# Patient Record
Sex: Male | Born: 1935
Health system: Southern US, Community
[De-identification: ages and names within clinical notes are randomized; demographics above are authoritative.]

## PROBLEM LIST (undated history)

## (undated) DIAGNOSIS — I639 Cerebral infarction, unspecified: Secondary | ICD-10-CM

## (undated) DIAGNOSIS — K648 Other hemorrhoids: Secondary | ICD-10-CM

## (undated) DIAGNOSIS — M1710 Unilateral primary osteoarthritis, unspecified knee: Secondary | ICD-10-CM

## (undated) DIAGNOSIS — C801 Malignant (primary) neoplasm, unspecified: Secondary | ICD-10-CM

## (undated) DIAGNOSIS — I2510 Atherosclerotic heart disease of native coronary artery without angina pectoris: Secondary | ICD-10-CM

## (undated) DIAGNOSIS — M179 Osteoarthritis of knee, unspecified: Secondary | ICD-10-CM

## (undated) DIAGNOSIS — R413 Other amnesia: Secondary | ICD-10-CM

## (undated) DIAGNOSIS — E785 Hyperlipidemia, unspecified: Secondary | ICD-10-CM

## (undated) DIAGNOSIS — I10 Essential (primary) hypertension: Secondary | ICD-10-CM

## (undated) DIAGNOSIS — T7840XA Allergy, unspecified, initial encounter: Secondary | ICD-10-CM

## (undated) HISTORY — DX: Atherosclerotic heart disease of native coronary artery without angina pectoris: I25.10

## (undated) HISTORY — DX: Osteoarthritis of knee, unspecified: M17.9

## (undated) HISTORY — PX: OTHER SURGICAL HISTORY: SHX169

## (undated) HISTORY — DX: Unilateral primary osteoarthritis, unspecified knee: M17.10

## (undated) HISTORY — DX: Other hemorrhoids: K64.8

## (undated) HISTORY — PX: CARPAL TUNNEL RELEASE: SHX101

## (undated) HISTORY — PX: WRIST FRACTURE SURGERY: SHX121

## (undated) HISTORY — DX: Other amnesia: R41.3

## (undated) HISTORY — DX: Hyperlipidemia, unspecified: E78.5

## (undated) HISTORY — DX: Allergy, unspecified, initial encounter: T78.40XA

---

## 1999-07-07 ENCOUNTER — Emergency Department (HOSPITAL_COMMUNITY): Admission: EM | Admit: 1999-07-07 | Discharge: 1999-07-07 | Payer: Self-pay | Admitting: Emergency Medicine

## 2003-12-19 IMAGING — CR DG CHEST 2V
2 series · 2 of 2 positions shown · non-contrast
Comparison: none

CLINICAL DATA: Preop respiratory exam for knee surgery.  
 CHEST TWO VIEWS:
 Heart size is normal.  The mediastinum is unremarkable.  The lungs are clear.  No acute soft tissue or bony findings.

[view not recorded (1 of 2)]
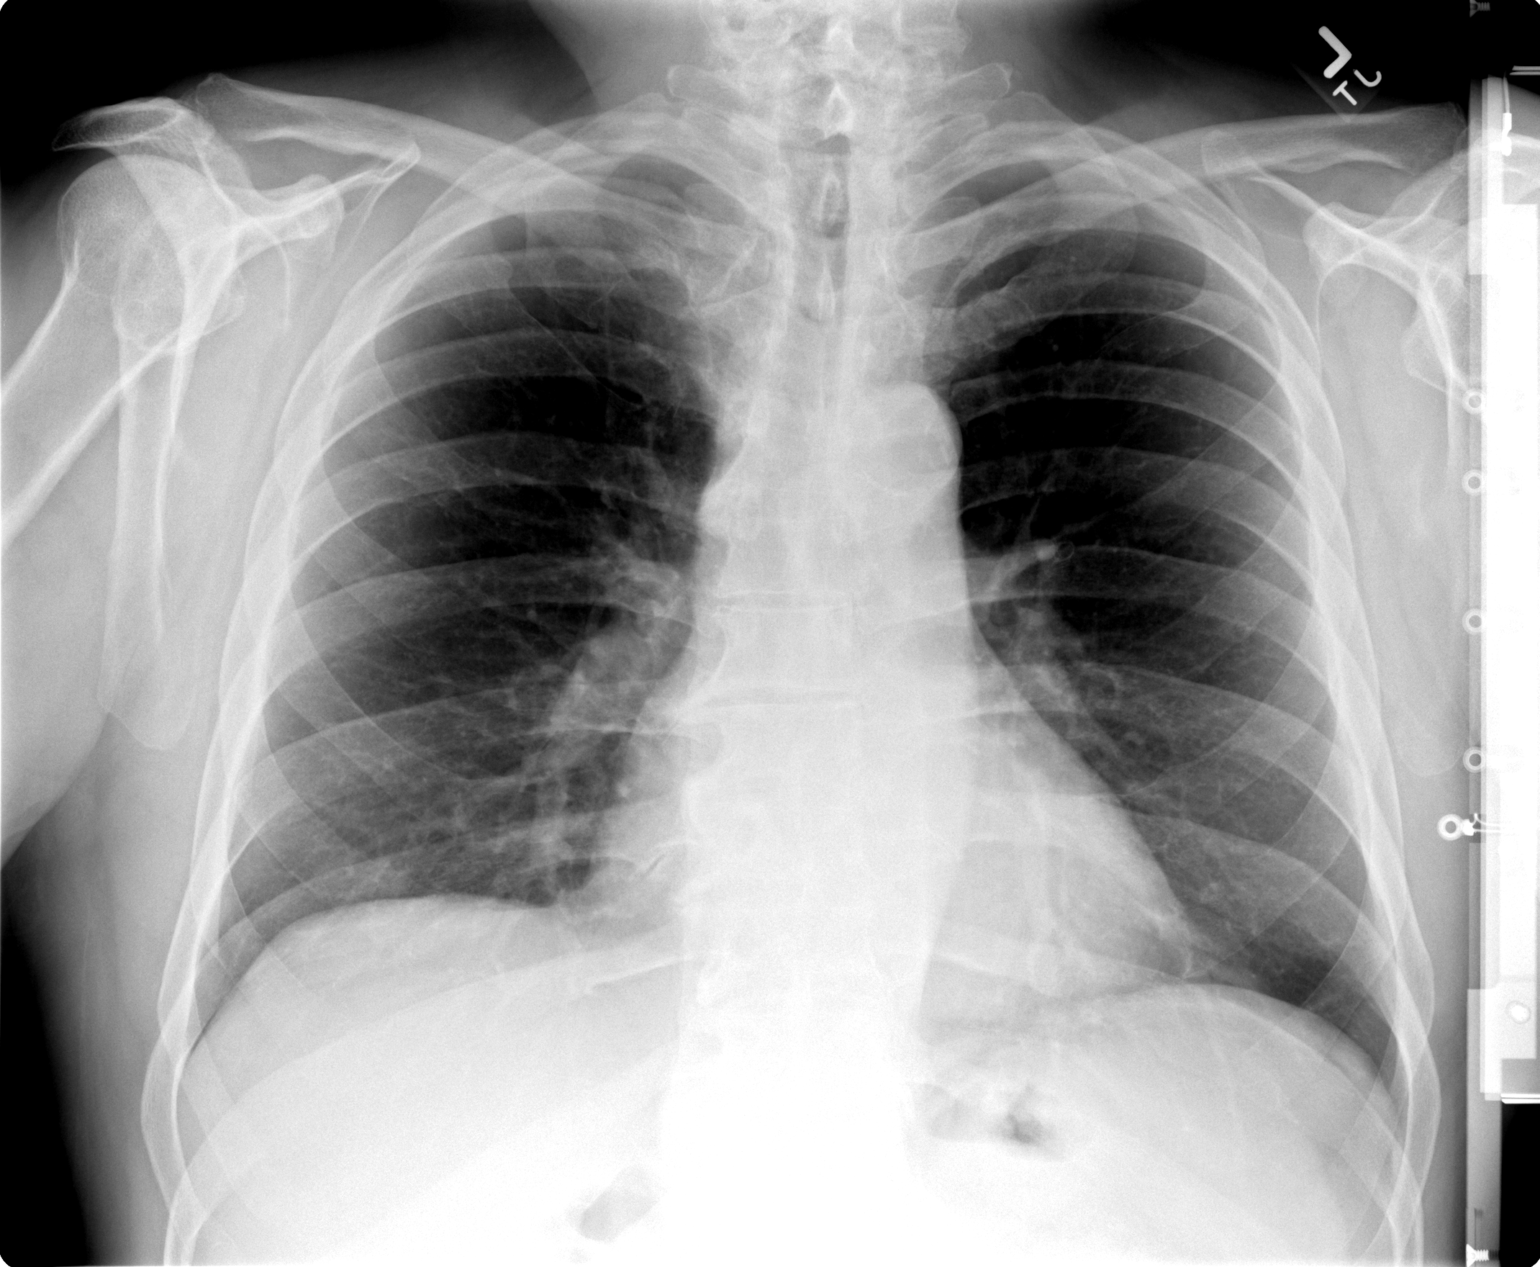

[view not recorded (2 of 2)]
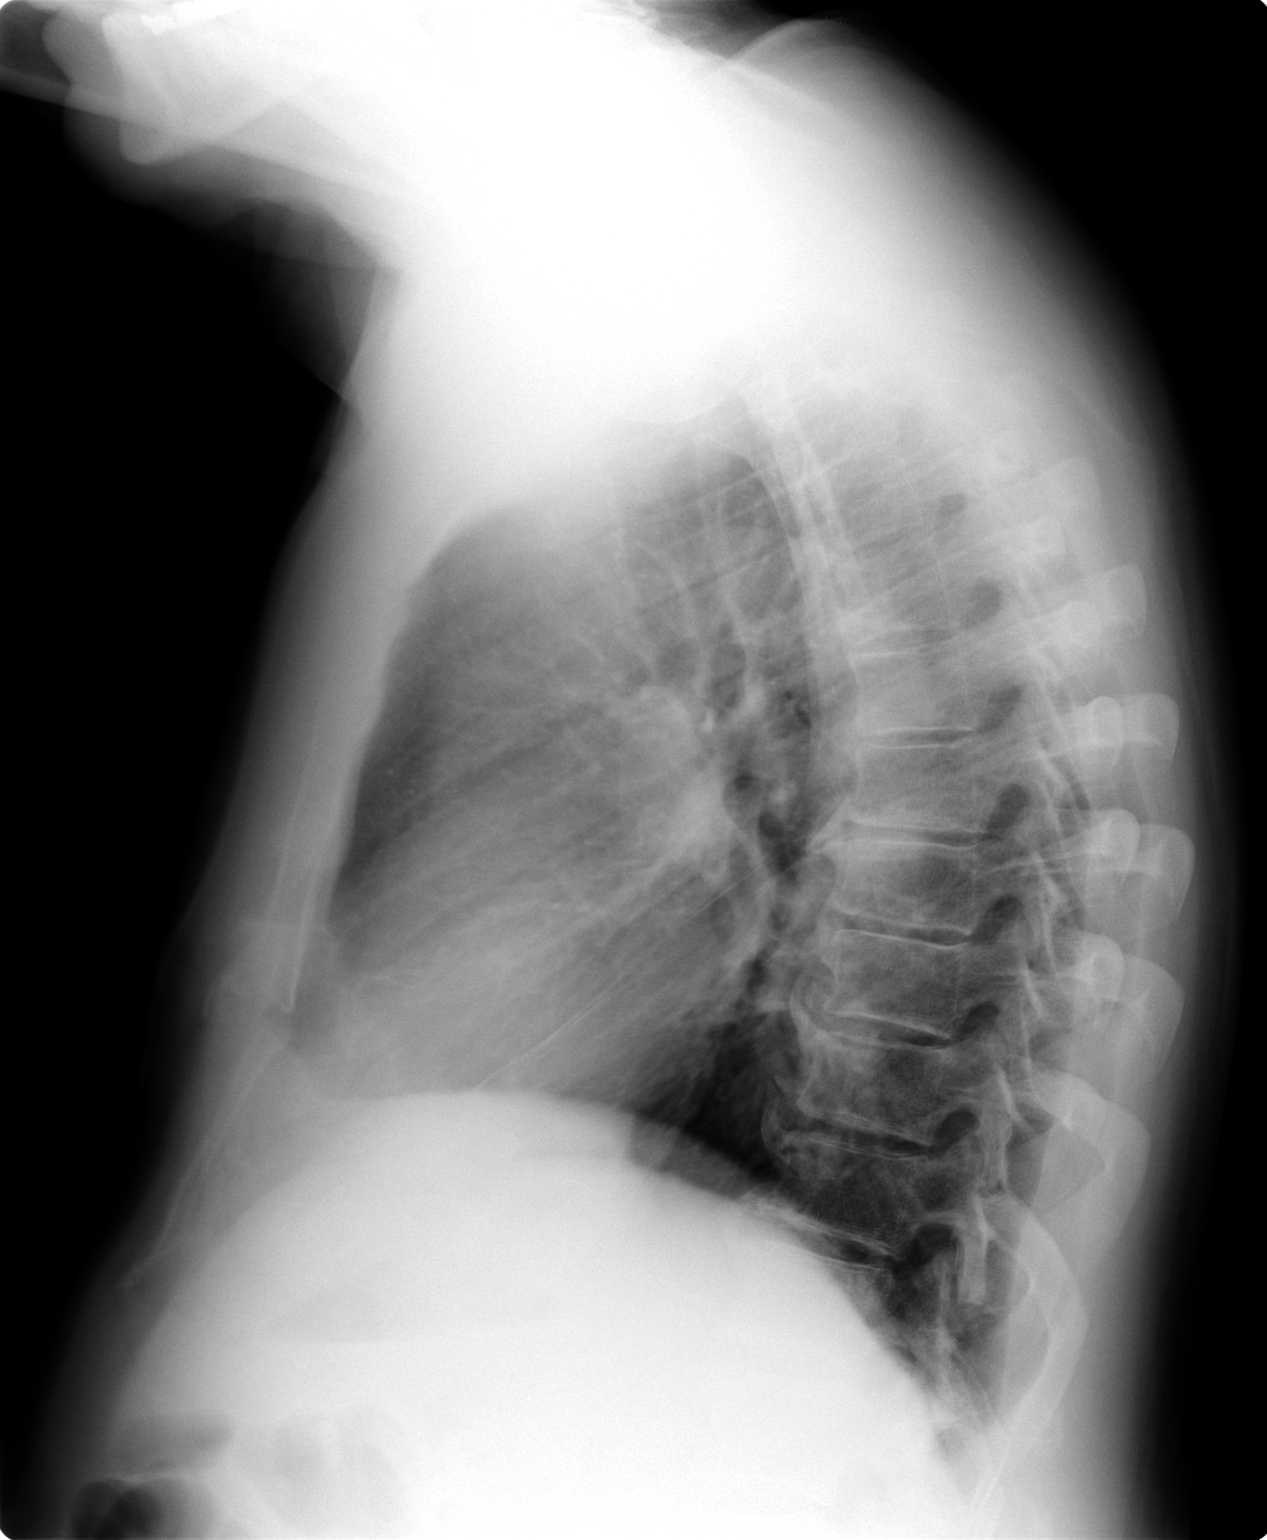

[2 of 2 positions shown; findings below may reference images not displayed]

IMPRESSION: No active disease.

## 2003-12-22 ENCOUNTER — Inpatient Hospital Stay (HOSPITAL_COMMUNITY): Admission: RE | Admit: 2003-12-22 | Discharge: 2003-12-25 | Payer: Self-pay | Admitting: Orthopedic Surgery

## 2004-02-19 HISTORY — PX: TOTAL KNEE ARTHROPLASTY: SHX125

## 2005-02-20 ENCOUNTER — Emergency Department (HOSPITAL_COMMUNITY): Admission: EM | Admit: 2005-02-20 | Discharge: 2005-02-20 | Payer: Self-pay | Admitting: Emergency Medicine

## 2006-09-18 ENCOUNTER — Ambulatory Visit: Payer: Self-pay | Admitting: Cardiology

## 2006-09-24 ENCOUNTER — Ambulatory Visit: Payer: Self-pay

## 2006-10-08 ENCOUNTER — Ambulatory Visit: Payer: Self-pay | Admitting: Cardiology

## 2006-10-15 ENCOUNTER — Ambulatory Visit: Payer: Self-pay | Admitting: Cardiology

## 2006-10-15 ENCOUNTER — Ambulatory Visit: Payer: Self-pay

## 2006-10-15 ENCOUNTER — Encounter: Payer: Self-pay | Admitting: Cardiology

## 2006-10-15 LAB — CONVERTED CEMR LAB
Cholesterol: 161 mg/dL (ref 0–200)
HDL: 62.4 mg/dL (ref >39.0)
LDL Cholesterol: 86 mg/dL (ref 0–99)
Total CHOL/HDL Ratio: 2.6
Triglycerides: 63 mg/dL (ref 0–149)
VLDL: 13 mg/dL (ref 0–40)

## 2007-02-19 HISTORY — PX: TOTAL KNEE ARTHROPLASTY: SHX125

## 2008-05-10 ENCOUNTER — Ambulatory Visit (HOSPITAL_COMMUNITY): Admission: RE | Admit: 2008-05-10 | Discharge: 2008-05-10 | Payer: Self-pay | Admitting: Orthopedic Surgery

## 2009-02-18 HISTORY — PX: TOTAL KNEE ARTHROPLASTY: SHX125

## 2009-02-18 HISTORY — PX: CARPAL TUNNEL RELEASE: SHX101

## 2010-01-16 ENCOUNTER — Inpatient Hospital Stay (HOSPITAL_COMMUNITY)
Admission: RE | Admit: 2010-01-16 | Discharge: 2010-01-19 | Payer: Self-pay | Source: Home / Self Care | Admitting: Orthopedic Surgery

## 2010-02-18 HISTORY — PX: CATARACT EXTRACTION: SUR2

## 2010-03-16 NOTE — Discharge Summary (Signed)
  [name], [name] NO.:  [id number]  MEDICAL RECORD NO.:  [id number]          PATIENT TYPE:  INP  LOCATION:  1602                         FACILITY:  [facility]  PHYSICIAN:  Priyansh Pry L. Dezra Mandella, P.A.DATE OF BIRTH:  [birth year]  DATE OF ADMISSION:  01/16/2010 DATE OF DISCHARGE:  01/19/2010                              DISCHARGE SUMMARY   ADMITTING DIAGNOSIS:  Osteoarthritis of the right knee.  DISCHARGE DIAGNOSES: 1. Osteoarthritis of the right knee. 2. Mild postoperative anemia.  OPERATION:  On January 16, 2010, the patient underwent Osteonics Scorpio cruciate sacrificing posterior stabilizing arthroplasty of the right knee.  ASSISTANT:  Tyan Dy L. Cherlynn [name].  BRIEF HISTORY:  This 77 year old male has had problems concerning his right knee.  Conservative care with injections and physical therapy as well as viscosupplementation has really not helped him for a long period of time.  He is a very active gentleman and this is markedly interrupting his lifestyle.  After much discussion including the risks and benefits of surgery, it was decided to go ahead for total-knee replacement arthroplasty.  COURSE IN THE HOSPITAL:  The patient tolerated the surgical procedure quite well.  He was placed on the total-knee protocol and did very well. He was very anxious to begin his protocol working with the physical therapy quite diligently.  We, of course, allowed weightbearing as tolerated and this was sustained by the patient.  He was placed on Coumadin protocol postoperatively after Lovenox while in the hospital. He did have a mild drop in his hemoglobin postoperatively to 9.2 but he was totally asymptomatic.  Dr. Simonne Come felt that he could be maintained in his home environment with some home health and once those arrangements were made he was discharged home.  On the day of discharge his wound was clean and dry.  Staples were all intact.  Neurovascular was  intact to the right lower extremity and the calf was soft.  LABORATORY DATA:  Laboratory values in the hospital hematologically showed a CBC on admission with a hemoglobin of 14.3, hematocrit was 40.8.  Final hemoglobin was 9.2 and the hematocrit was 26.7.  IMAGING:  The postoperative knee replacement x-rays done in the recovery room showed changes of right knee replacement.  No hardware or bony complicating features.  CONDITION ON DISCHARGE:  Improved.  PLAN:  The patient is discharged home.  He is to continue with his medications on a p.r.n. basis.  We have sent him home with Tylox for discomfort, Robaxin as a muscle relaxant and Lovenox/Coumadin to continue with Coumadin protocol.  All questions were encouraged and answered in his hospital room prior to discharge.  He is return to the office approximately two weeks after the date of surgery.     Craig [name].     DLU/MEDQ  D:  03/06/2010  T:  03/06/2010  Job:  [id number]  cc:   Craig [name], M.D. Fax: [phone]  Electronically Signed by Alvera Novel  on 03/14/2010 01:10:28 PM

## 2010-04-30 LAB — CBC
HCT: 26.7 % — ABNORMAL LOW (ref 39.0–52.0)
HCT: 29.2 % — ABNORMAL LOW (ref 39.0–52.0)
Hemoglobin: 10 g/dL — ABNORMAL LOW (ref 13.0–17.0)
Hemoglobin: 9.2 g/dL — ABNORMAL LOW (ref 13.0–17.0)
MCH: 33.2 pg (ref 26.0–34.0)
MCH: 33.5 pg (ref 26.0–34.0)
MCHC: 34.2 g/dL (ref 30.0–36.0)
MCHC: 34.5 g/dL (ref 30.0–36.0)
MCV: 97 fL (ref 78.0–100.0)
MCV: 97.1 fL (ref 78.0–100.0)
Platelets: 112 10*3/uL — ABNORMAL LOW (ref 150–400)
Platelets: 94 10*3/uL — ABNORMAL LOW (ref 150–400)
RBC: 2.75 MIL/uL — ABNORMAL LOW (ref 4.22–5.81)
RBC: 3.01 MIL/uL — ABNORMAL LOW (ref 4.22–5.81)
RDW: 12.1 % (ref 11.5–15.5)
RDW: 12.3 % (ref 11.5–15.5)
WBC: 5.7 10*3/uL (ref 4.0–10.5)
WBC: 6.1 10*3/uL (ref 4.0–10.5)

## 2010-04-30 LAB — BASIC METABOLIC PANEL
BUN: 10 mg/dL (ref 6–23)
CO2: 26 mEq/L (ref 19–32)
Calcium: 8.6 mg/dL (ref 8.4–10.5)
Chloride: 108 mEq/L (ref 96–112)
Creatinine, Ser: 0.97 mg/dL (ref 0.4–1.5)
GFR calc Af Amer: >60 mL/min (ref >60)
GFR calc non Af Amer: >60 mL/min (ref >60)
Glucose, Bld: 119 mg/dL — ABNORMAL HIGH (ref 70–99)
Potassium: 3.8 mEq/L (ref 3.5–5.1)
Sodium: 141 mEq/L (ref 135–145)

## 2010-04-30 LAB — PROTIME-INR
INR: 1.43 (ref 0.00–1.49)
INR: 1.79 — ABNORMAL HIGH (ref 0.00–1.49)
Prothrombin Time: 17.6 seconds — ABNORMAL HIGH (ref 11.6–15.2)
Prothrombin Time: 21 seconds — ABNORMAL HIGH (ref 11.6–15.2)

## 2010-05-01 LAB — CBC
HCT: 33.7 % — ABNORMAL LOW (ref 39.0–52.0)
HCT: 40.8 % (ref 39.0–52.0)
Hemoglobin: 11.3 g/dL — ABNORMAL LOW (ref 13.0–17.0)
Hemoglobin: 14.3 g/dL (ref 13.0–17.0)
MCH: 32.8 pg (ref 26.0–34.0)
MCH: 34.3 pg — ABNORMAL HIGH (ref 26.0–34.0)
MCHC: 33.5 g/dL (ref 30.0–36.0)
MCHC: 35.1 g/dL (ref 30.0–36.0)
MCV: 97.7 fL (ref 78.0–100.0)
MCV: 97.7 fL (ref 78.0–100.0)
Platelets: 125 10*3/uL — ABNORMAL LOW (ref 150–400)
Platelets: 137 10*3/uL — ABNORMAL LOW (ref 150–400)
RBC: 3.45 MIL/uL — ABNORMAL LOW (ref 4.22–5.81)
RBC: 4.17 MIL/uL — ABNORMAL LOW (ref 4.22–5.81)
RDW: 12.2 % (ref 11.5–15.5)
RDW: 12.4 % (ref 11.5–15.5)
WBC: 5.3 10*3/uL (ref 4.0–10.5)
WBC: 6.1 10*3/uL (ref 4.0–10.5)

## 2010-05-01 LAB — BASIC METABOLIC PANEL
BUN: 9 mg/dL (ref 6–23)
CO2: 26 mEq/L (ref 19–32)
Calcium: 8.6 mg/dL (ref 8.4–10.5)
Chloride: 107 mEq/L (ref 96–112)
Creatinine, Ser: 0.89 mg/dL (ref 0.4–1.5)
GFR calc Af Amer: >60 mL/min (ref >60)
GFR calc non Af Amer: >60 mL/min (ref >60)
Glucose, Bld: 149 mg/dL — ABNORMAL HIGH (ref 70–99)
Potassium: 4 mEq/L (ref 3.5–5.1)
Sodium: 140 mEq/L (ref 135–145)

## 2010-05-01 LAB — PROTIME-INR
INR: 1.03 (ref 0.00–1.49)
INR: 1.07 (ref 0.00–1.49)
Prothrombin Time: 13.7 seconds (ref 11.6–15.2)
Prothrombin Time: 14.1 seconds (ref 11.6–15.2)

## 2010-05-01 LAB — SURGICAL PCR SCREEN
MRSA, PCR: NEGATIVE
Staphylococcus aureus: NEGATIVE

## 2010-05-31 LAB — HEMOGLOBIN AND HEMATOCRIT, BLOOD
HCT: 43 % (ref 39.0–52.0)
Hemoglobin: 14.8 g/dL (ref 13.0–17.0)

## 2010-05-31 LAB — BASIC METABOLIC PANEL
BUN: 11 mg/dL (ref 6–23)
CO2: 26 mEq/L (ref 19–32)
Calcium: 9.5 mg/dL (ref 8.4–10.5)
Chloride: 105 mEq/L (ref 96–112)
Creatinine, Ser: 1.02 mg/dL (ref 0.4–1.5)
GFR calc Af Amer: >60 mL/min (ref >60)
GFR calc non Af Amer: >60 mL/min (ref >60)
Glucose, Bld: 99 mg/dL (ref 70–99)
Potassium: 4.1 mEq/L (ref 3.5–5.1)
Sodium: 139 mEq/L (ref 135–145)

## 2010-07-03 NOTE — Op Note (Signed)
NAME:  Craig [name], ABBEY NO.:  [id number]   MEDICAL RECORD NO.:  [id number]          PATIENT TYPE:  AMB   LOCATION:  DAY                          FACILITY:  [facility]   PHYSICIAN:  Marlowe Kays, M.D.  DATE OF BIRTH:  [birth year]   DATE OF PROCEDURE:  05/10/2008  DATE OF DISCHARGE:                               OPERATIVE REPORT   PREOPERATIVE DIAGNOSIS:  Right carpal tunnel syndrome.   POSTOPERATIVE DIAGNOSIS:  Right carpal tunnel syndrome.   OPERATION:  Decompression of median nerve, right wrist and hand.   SURGEON:  Marlowe Kays, M.D.   ASSISTANT:  Nurse.   ANESTHESIA:  IV regional.   JUSTIFICATION FOR PROCEDURE:  Severe pain and numbness with EMG and  nerve conduction studies documenting severe carpal tunnel syndrome with  severe median nerve compression.   DESCRIPTION OF PROCEDURE:  Satisfactory IV regional anesthesia,  prophylactic antibiotics.  The right arm from the midforearm to the  fingertips was prepped with DuraPrep, draped in a sterile field, and  marked out a curved incision along the base of the thenar eminence,  crossing obliquely over the flexor crease of the wrist and the distal  forearm.  The palmaris longus tendon was identified and retracted to the  radial side, beneath the median nerve was identified.  There was a  little yellowish discoloration around it, consistent with perhaps some  postoperative bleeding.  This could have been old since he has had  previous right wrist surgery.  I released the median nerve well up into  the distal forearm as long as there was any compression.  I then  progressively released skin, subcutaneous tissue, and a very thick  palmar fascia into the distal hand.  Bleeders were coagulated with  bipolar cautery.  The median nerve was very compressed and discolored in  the carpal canal.  I dissected along the branches carefully with a small  hemostat.  I then irrigated the wound well with sterile saline,  closed  the skin and subcutaneous tissue only with interrupted 4-0 nylon  mattress sutures.  Betadine, Adaptic, and a dry sterile dressing were  applied, followed by a volar plaster splint.  The tourniquet was  released.  He tolerated the procedure well and was taken to recovery in  satisfactory condition with no known complications.           ______________________________  Marlowe Kays, M.D.     JA/MEDQ  D:  05/10/2008  T:  05/10/2008  Job:  [id number]

## 2010-07-03 NOTE — Assessment & Plan Note (Signed)
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  We had seen him  back in 2002 for screenings and for coronary disease.  At that time he  had a friend who had developed coronary disease; and he wanted  evaluation.  His Cardiolite scan was negative at that time.  He has done  well since then.  He has had no chest pain, shortness of breath, or  heart palpitations.  He is quite active and plays tennis regularly at  the age of 16.   PAST MEDICAL HISTORY SIGNIFICANT FOR:  1. A total knee replacement on the left.  2. He has hyperlipidemia, but has not been treated although he      occasionally take samples of Lipitor.   FAMILY HISTORY:  Borderline positive.  His father died suddenly at 36,  possibly of a heart attack.  He has two siblings with no heart disease.   MEDICATIONS:  He is currently not in any medications.   PHYSICAL EXAMINATION:  Blood pressure is 122/82 and the pulse 60 and  regular.  There was no venous distention.  The carotid pulses were full without  bruits.  CHEST:  Clear.  CARDIAC:  Rhythm was regular.  I could hear no murmurs or gallops.  ABDOMEN:  Soft with normal bowel sounds.  There was no  hepatosplenomegaly.  Peripheral pulses were full with no peripheral  edema.   IMPRESSION:  1. Hyperlipidemia.  2. Borderline family history for coronary heart disease.   RECOMMENDATIONS:  Louanne Skye is concerned about the possibility of heart  disease because a friend of his recently developed an emergency  situation with his heart.  Dick's major risk factors are his age and  sex, and hyperlipidemia, which has been untreated, and borderline family  history.  We  discussed the options and decided to screen him with a  rest/stress exercise Myoview scan.  He is also to get [place] his lipid  profile from Dr. Ernestene Mention office.  We will be in touch with him by phone  after results of his cholesterol panel and his stress test.  We may want  to put him on a statin.     Bruce Elvera Lennox Juanda Chance, MD, [facility]  Electronically Signed   BRB/MedQ  DD: 09/18/2006  DT: 09/19/2006  Job #: [id number]

## 2010-07-03 NOTE — Assessment & Plan Note (Signed)
[facility]                            CARDIOLOGY OFFICE NOTE   NAME:[name], Craig VELAQUEZ                    MRN:          [id number]  DATE:10/08/2006                            DOB:          [birth year]    Craig [name] returns for a followup evaluation and discussion of his  recent test.  We did a stress Myoview scan which showed no ischemia but  his blood pressure went from 150/90 to 222/88.  He also had some ST-T  changes but no perfusion defect on scan.   On examination today, his blood pressure was normal at 137/81, his pulse  was 64 and regular.  There was no venous distention.  The cardiac rhythm  was regular and no murmurs or gallops.   I discussed with Craig [name] the abnormal blood pressure response to exercise  and associated ST-T changes.  I think the ECG change is related to his  elevated blood pressure.  He also has had an elevated cholesterol in his  past but he indicates that his HDL had been quite high in the 80- to 90-  range.  We will plan to get an echocardiogram to evaluate him for LVH.  If he has LVH then will consider treatment of his exercise-induced  hypertension.  He already follows a low-salt diet and exercises fairly  regularly and he says that he plans to lose about 10 pounds.  Will also  get a fasting lipid profile and decide if primary prevention with a  statin drug beyond diet alone is indicated.  I told him I would give him  a call after I have the results of the lipid profile and the  echocardiogram.     Bruce R. Juanda Chance, MD, [facility]  Electronically Signed    BRB/MedQ  DD: 10/08/2006  DT: 10/09/2006  Job #: [id number]

## 2010-07-06 NOTE — Discharge Summary (Signed)
NAMETHEODIS, KINSEL NO.:  [id number]   MEDICAL RECORD NO.:  [id number]          PATIENT TYPE:  INP   LOCATION:  0452                         FACILITY:  [facility]   PHYSICIAN:  Marlowe Kays, M.D.  DATE OF BIRTH:  Jul 16, 1931   DATE OF ADMISSION:  12/22/2003  DATE OF DISCHARGE:  12/25/2003                                 DISCHARGE SUMMARY   ADMISSION DIAGNOSIS:  Osteoarthritis of the left knee.   POSTOPERATIVE DIAGNOSES:  1.  Osteoarthritis, left knee.  2.  Mild postoperative anemia.   OPERATION:  On December 22, 2003, the patient underwent Osteonics total knee  replacement arthroplasty of the left knee.  Dr. Ranee Gosselin assisted.   BRIEF HISTORY:  This 77 year old white male with progressive problems  concerning his left knee combined with x-rays showing bone-on-bone  deformity, caused interfering with his daily activities.  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  Unfortunately, he has had difficulty getting about due to this pain and has  consented to undergo total knee replacement arthroplasty to the left knee.  Risks and benefits have been discussed with the patient by Dr. Simonne Come.   HOSPITAL COURSE:  Patient tolerated the surgical procedure quite well.  He  maintained a very positive attitude throughout his hospitalization.  He  worked diligently with the total knee protocol.  The arrangements were made  for home health with [place].  Patient had no temperature postop, and the  wound remained dry.  It was felt that he could be maintained in his home  environment.  He was discharged by Dr. Simonne Come on Tylox and Coumadin.  The  Coumadin will be continued for four weeks after the date of surgery.  He is  told to call if any problems.   Laboratory values in the hospital hematologically showed a preoperative CBC  completely within normal limits and postoperatively.  His final hemoglobin  was 10.5,  hematocrit was 30.0.  Blood chemistries all were normal.  Urinalysis negative for urinary tract infection.   Electrocardiogram showed a normal sinus rhythm.   Chest x-ray showed no active disease.   CONDITION ON DISCHARGE:  Improved and stable.   PLAN:  Patient was discharged to his home in the care of his family to use  [facility].  Continue weightbearing as tolerated on the operative  extremity.  Return to see Dr. Simonne Come in two weeks after the date of  surgery.  Continue with home medications and diet per his physician.      DLU/MEDQ  D:  01/03/2004  T:  01/03/2004  Job:  [id number]

## 2010-07-06 NOTE — Procedures (Signed)
[facility]  Patient:    Craig [name], Craig [name]                    MRN: [id number] Adm. Date:  [id number] Attending:  Sabino Gasser CC:         Anna Genre. Little, M.D.   Procedure Report  PROCEDURE:  Colonoscopy.  INDICATIONS:  Colon cancer screening.  ANESTHESIA:  Demerol 70 mg and Versed 7 mg.  DESCRIPTION OF PROCEDURE:  With the patient mildly sedated in the left lateral decubitus position, the Olympus videoscopic colonoscope was inserted in the rectum and passed under direct vision to the cecum.  The cecum was identified by the ileocecal valve and appendiceal orifice, both of which were photographed.  We entered into the terminal ileum, which also appeared normal and was photographed.  From this point, the colonoscope was slowly withdrawn, taking circumferential views of the entire colon mucosa, stopping only in the rectum which appeared normal on direct view.  It showed small internal hemorrhoids on retroflexed view and on pull through the anal canal. Photographs were taken.  The endoscope was withdrawn.  The patients vital signs and pulse oximetry remained stable.  The patient tolerated the procedure well without apparent complications.  FINDINGS:  Internal hemorrhoids.  Otherwise unremarkable colonoscopic examination, including terminal ileum.  PLAN:  Have the patient follow up with me in approximately five years or as needed. DD:  01/02/00 TD:  01/02/00 Job: [id number]

## 2010-07-06 NOTE — H&P (Signed)
NAMESTRUMMER, CANIPE NO.:  [id number]   MEDICAL RECORD NO.:  [id number]          PATIENT TYPE:  INP   LOCATION:  0452                         FACILITY:  [facility]   PHYSICIAN:  Marlowe Kays, M.D.  DATE OF BIRTH:  [birth year]   DATE OF ADMISSION:  12/22/2003  DATE OF DISCHARGE:                                HISTORY & PHYSICAL   CHIEF COMPLAINT:  Pain in my left knee.   HISTORY OF PRESENT ILLNESS:  This 77 year old white male seen by Dr.  Simonne Come for continuing and progressive problems concerning pain into his  left knee.  He has had Hyalgan injections into the knee which has helped  minimally.  X-rays have shown near bone-on-bone deformity in the knee.  This  is a very active gentleman, who is an international traveler and finds his  knee to be quite interfering with his day to day activity.  His pain in his  knee is affecting his whole extremity as well as giving him back pain.  We  plan to have a revisional tibial stem and posterior cruciate sacrificing  Scorpio knee.  Hopefully this will allow him to return to his very active  lifestyle, including the fact that he is an avid Armed forces operational officer.   PAST MEDICAL HISTORY:  This gentleman has been in excellent health  throughout his lifetime.  Dr. Catha Gosselin is his cardiologist.  He sees him  for just routine physicals.  He takes no medications at present time, has no  medical allergies.   FAMILY HISTORY:  Noncontributory.   SOCIAL HISTORY:  The patient is married.  He is in Airline pilot, Set designer, and  importing.  He has no intake of tobacco products, has 2-3 alcoholic  beverages a day, has five children and his wife, Harriett Sine, will be his  caregiver after surgery.   REVIEW OF SYSTEMS:  CNS:  No seizure, stroke, or paralysis, numbness, double  vision.  RESPIRATORY:  No productive cough or hemoptysis.  No shortness of  breath.  CARDIOVASCULAR:  No chest pain, no angina, no orthopnea.  GASTROINTESTINAL:  No nausea,  vomiting, melena, or bloody stool.  GENITOURINARY:  No discharge, dysuria, or hematuria.  MUSCULOSKELETAL:  Primarily in present illness of his left knee.   PHYSICAL EXAMINATION:  GENERAL:  Alert and cooperative, friendly 77 year old  white male, looking somewhat younger than his stated age.  VITAL SIGNS:  Blood pressure 152/78 seated, pulse 72 regular, respirations  12 unlabored.  HEENT:  Normocephalic.  PERRLA.  EOM intact.  Oropharynx clear.  CHEST:  Clear to auscultation.  No rhonchi or rales.  HEART:  Regular rate and rhythm.  No murmurs are heard.  ABDOMEN:  Soft, nontender.  Liver or spleen not felt.  GENITALIA/RECTAL:  Not done.  Not pertinent for present illness.  EXTREMITIES:  The patient has pain and crepitus with range of motion of his  left knee.  There is a slight varus deformity as well.   ADMITTING DIAGNOSES:  Osteoarthritis of the left knee.   PLAN:  The patient will undergo a left total knee replacement arthroplasty.  Should we have any  problems in the hospital, we will certainly contact Dr.  Catha Gosselin to follow along with [place] during this patient's hospitalization.  As I understand it, Dr. Clarene Duke has cleared him for surgery.      DLU/MEDQ  D:  12/23/2003  T:  12/23/2003  Job:  [id number]   cc:   Caryn Bee L. Little, M.D.  [address]  [place]  [place] [address]  Fax: [phone]

## 2010-07-06 NOTE — Op Note (Signed)
NAME:  Craig [name], Craig [name] NO.:  [id number]   MEDICAL RECORD NO.:  [id number]          PATIENT TYPE:  INP   LOCATION:  0005                         FACILITY:  [facility]   PHYSICIAN:  Marlowe Kays, M.D.  DATE OF BIRTH:  1931-12-22   DATE OF PROCEDURE:  12/22/2003  DATE OF DISCHARGE:                                 OPERATIVE REPORT   PREOPERATIVE DIAGNOSIS:  Osteoarthritis, left knee.   POSTOPERATIVE DIAGNOSIS:  Osteoarthritis, left knee.   OPERATION:  Osteonics total knee replacement, left.   SURGEON:  Marlowe Kays, M.D.   ASSISTANT:  Georges Lynch. Darrelyn Hillock, M.D.   ANESTHESIA:  General.   INDICATIONS FOR PROCEDURE:  This man is 45, a very active athlete with  severe varus deformity associated with tricompartmental arthritis.  He had a  medial shift of the femur on the tibia.  He felt that, with his level of  activity and severe varus deformity, that the posterior cruciate sacrificing  modalities as well as deploying a revision-type long stem tibial component  would give more stability and help protect him from a recurring varus  problem and loosening of the tibial component.   PROCEDURE:  Prophylactic antibiotics, satisfactory spinal anesthesia.  Pneumatic tourniquet.  Sure Foot lateral hip positioner.  The left leg was  prepped from tourniquet to ankle with Duraprep and draped in a sterile  field.  Ioban employed.  Vertical incision down to the patellar mechanism.  With a median parapatellar incision, I opened the joint and freed up the  patellar mechanism so that it could be everted and the knee flexed.  I  undermined the __________ and medial collateral ligament off the tibia  working backward, freeing up the medial knee.  The ACL was basically  destroyed.  Remnants of it, posterior cruciate ligament and anterior  portions of both menisci were removed.  Osteophytes were removed from the  femur and patella.  The patella was sized to a size 26.  I made a  5/16-inch  drill hole in the distal femur followed by the canal finder and the axis  aligner except for a 5 degree valgus cut from the left knee.  Because he had  a flexion contracture, I elected to take 12 mm off the distal femur.  This  cut was made.  The femur sized at a #9.  Scribe lines were placed on the  distal femur for the distal femoral cutting jig, and anterior and posterior  cuts and posterior and anterior chamferings were then made.  I then went to  the tibia, where I re-leveled and cut and completed the removal of the  menisci and posterior cruciate ligament.  I then sized the tibia to a #9 and  made my initial intramedullary hole followed by the canal finder.  I then  used my intramedullary rod for a 0 degree slope cut, removing 4 mm from the  depressed medial tibial plateau.  After this cut was made, I then placed the  jig on the femur for creating the patellar groove.  I then used a microsaw  to remove some of the bone  from the intercondylar notch and then followed  this with both the cutter and the impacter to create the femoral notch.  Remnants of bone were removed.  We then placed the laminar spreader and  removed remnants of bone from the posterior condyles.  Went through a trial  reduction and found that a 12 mm insert was probably going to be the  appropriate size and that no additional bone resection was indicated.  Ligament balance seemed to be perfect.  We used the external aligning rods  __________ bimalleolar distance to make a tentative placement for the tibial  tray.  While the knee was in extension, we then went ahead to use the 10 mm  recess cutting jig for the patella, followed by the guide for making the  three fixation drill holes.  I then put a trial 26 patella and trimmed bone  from around the perimeter of it.  I then went to the tibia and used the  trocar, reaming up to a 17 mm, which seemed to be as tight as was  appropriate.  We then used the off-set  guide on the tibia to dial for the  appropriate position of the tibial tray, which turned out to be exactly what  we had analyzed with our initial primary-type cuts.  This turned out to be a  4 mm off-set on the tibial component.  We then went through a trial  reduction using a trial of these components, using 80 mm distal tip, and the  knee was nice and stable.  Accordingly, while the final tibial components  were assembled, we water-picked the knee and then used methylmethacrylate to  individually glue in the three components, starting first with the tibia.  I  then used glue only around the flare of the proximal portion of the tibial  component, as we would for a routine primary, using a porous coated for the  distal 80 mm tip.  After excess methacrylate had been trimmed from around  the tibial component, we then glued in the femur and the patella in a  similar fashion and held the knee while the glue hardened.  Remnants of glue  were then removed, including the posterior condyles.  With a 12 mm spacer  clearly the appropriate size, we went ahead and after irrigating the wound  well, placed a final Scorpio posterior cruciate sacrificing 12 mm tray and  then took the knee through a full range of motion with good stability.  The  patellar mechanism was checked, and no lateral release was required.  A  Hemovac was placed.  I then closed the wound with interrupted #1 Vicryl in  the synovium and capsule distally and two layers in the quadriceps and then  used a combination of 0 and 2-0 Vicryl in the subcutaneous tissues and  staples in the skin.  Betadine and dry sterile Adaptic dressing was applied,  followed by a knee immobilizer.  He tolerated the procedure well and was  taken to the recovery room in satisfactory condition with no known  complications.  The tourniquet time was a few minutes over two hours.  We  let the tourniquet down at two hours.  There was essentially no blood loss and  no blood replacement.      JA/MEDQ  D:  12/22/2003  T:  12/22/2003  Job:  [id number]

## 2010-09-11 ENCOUNTER — Encounter: Payer: Self-pay | Admitting: Internal Medicine

## 2010-10-30 ENCOUNTER — Ambulatory Visit (AMBULATORY_SURGERY_CENTER): Payer: Medicare PPO | Admitting: *Deleted

## 2010-10-30 ENCOUNTER — Encounter: Payer: Self-pay | Admitting: Internal Medicine

## 2010-10-30 VITALS — Ht 67.0 in | Wt 176.9 lb

## 2010-10-30 DIAGNOSIS — Z1211 Encounter for screening for malignant neoplasm of colon: Secondary | ICD-10-CM

## 2010-10-30 MED ORDER — SUPREP BOWEL PREP KIT 17.5-3.13-1.6 GM/177ML PO SOLN: 1.0000 | Freq: Once | ORAL | Status: DC

## 2010-11-13 ENCOUNTER — Other Ambulatory Visit: Payer: Self-pay | Admitting: Internal Medicine

## 2012-02-07 ENCOUNTER — Other Ambulatory Visit (HOSPITAL_COMMUNITY): Payer: Self-pay | Admitting: Internal Medicine

## 2012-02-07 ENCOUNTER — Ambulatory Visit (HOSPITAL_COMMUNITY)
Admission: RE | Admit: 2012-02-07 | Discharge: 2012-02-07 | Disposition: A | Discharge status: Home / Self Care | Payer: Medicare Other | Source: Ambulatory Visit | Attending: Internal Medicine | Admitting: Internal Medicine

## 2012-02-07 DIAGNOSIS — H5461 Unqualified visual loss, right eye, normal vision left eye: Secondary | ICD-10-CM

## 2012-02-07 DIAGNOSIS — I639 Cerebral infarction, unspecified: Secondary | ICD-10-CM

## 2012-02-07 DIAGNOSIS — H5460 Unqualified visual loss, one eye, unspecified: Secondary | ICD-10-CM | POA: Insufficient documentation

## 2012-02-07 DIAGNOSIS — Z8673 Personal history of transient ischemic attack (TIA), and cerebral infarction without residual deficits: Secondary | ICD-10-CM | POA: Insufficient documentation

## 2012-02-07 DIAGNOSIS — H539 Unspecified visual disturbance: Secondary | ICD-10-CM | POA: Insufficient documentation

## 2012-02-07 DIAGNOSIS — J3489 Other specified disorders of nose and nasal sinuses: Secondary | ICD-10-CM | POA: Insufficient documentation

## 2012-02-07 MED ORDER — GADOBENATE DIMEGLUMINE 529 MG/ML IV SOLN
16.0000 mL | Freq: Once | INTRAVENOUS | Status: AC | PRN
  Administered 2012-02-07: 16 mL via INTRAVENOUS

## 2012-02-10 LAB — POCT I-STAT, CHEM 8
BUN: 18 mg/dL (ref 6–23)
Calcium, Ion: 1.22 mmol/L (ref 1.13–1.30)
Chloride: 108 mEq/L (ref 96–112)
Creatinine, Ser: 1.1 mg/dL (ref 0.50–1.35)
Glucose, Bld: 92 mg/dL (ref 70–99)
HCT: 41 % (ref 39.0–52.0)
Hemoglobin: 13.9 g/dL (ref 13.0–17.0)
Potassium: 3.8 mEq/L (ref 3.5–5.1)
Sodium: 143 mEq/L (ref 135–145)
TCO2: 24 mmol/L (ref 0–100)

## 2012-12-29 ENCOUNTER — Encounter: Payer: Self-pay | Admitting: Internal Medicine

## 2012-12-31 ENCOUNTER — Encounter: Payer: Self-pay | Admitting: *Deleted

## 2013-03-16 ENCOUNTER — Ambulatory Visit (INDEPENDENT_AMBULATORY_CARE_PROVIDER_SITE_OTHER): Payer: Medicare Other | Admitting: Internal Medicine

## 2013-03-16 ENCOUNTER — Encounter: Payer: Self-pay | Admitting: Internal Medicine

## 2013-03-16 VITALS — BP 132/80 | HR 80 | Ht 67.0 in | Wt 181.0 lb

## 2013-03-16 DIAGNOSIS — Z1211 Encounter for screening for malignant neoplasm of colon: Secondary | ICD-10-CM

## 2013-03-16 MED ORDER — MOVIPREP 100 G PO SOLR: 1.0000 | Freq: Once | ORAL | Status: DC

## 2013-03-16 NOTE — Progress Notes (Signed)
Craig [name] [birth year] [id number]  Note: This dictation was prepared with Dragon digital system. Any transcriptional errors that result from this procedure are unintentional.   History of Present Illness:  This is a 80 year old white male who is here to discuss  screening colonoscopy. His last exam in 2001 by Dr Lajoyce Corners showed internal hemorrhoids. He denies any GI symptoms. There is no family history of colon cancer. His last hemoglobin was 14.8 with a hematocrit of 43.5.    Past Medical History  Diagnosis Date  . Knee osteoarthritis   . Hyperlipidemia     pt states he never had high cholesterol  . Internal hemorrhoids     Past Surgical History  Procedure Laterality Date  . Total knee arthroplasty Left 2006  . Carpal tunnel release  1009  . Total knee arthroplasty Right 2011    Allergies  Allergen Reactions  . Vioxx [Rofecoxib] Rash    Family history and social history have been reviewed.  Review of Systems:   The remainder of the 10 point ROS is negative except as outlined in the H&P  Physical Exam: General Appearance Well developed, in no distress Eyes  Non icteric  HEENT  Non traumatic, normocephalic  Mouth No lesion, tongue papillated, no cheilosis Neck Supple without adenopathy, thyroid not enlarged, no carotid bruits, no JVD Lungs Clear to auscultation bilaterally COR Normal S1, normal S2, regular rhythm, no murmur, quiet precordium Abdomen soft nontender. Normoactive bowel sounds. Liver edge at costal margin Rectal soft Hemoccult negative stool Extremities  No pedal edema Skin No lesions Neurological Alert and oriented x 3 Psychological Normal mood and affect  Assessment and Plan:   Problem #12 80 year old white male who comes to discuss screening colonoscopy. We have discussed the indications. At his age guidelines  Are equivocal depending on pt's health and preference. We agreed though, that we will go ahead just to make sure he doesn't have colon  polyps. We will use a normal prep and propofol sedation. I have discussed the procedure and he agrees to proceed.    Delfin Edis 03/16/2013

## 2013-03-16 NOTE — Patient Instructions (Signed)
You have been scheduled for a colonoscopy with propofol. Please follow written instructions given to you at your visit today.  Please pick up your prep kit at the pharmacy within the next 1-3 days. If you use inhalers (even only as needed), please bring them with you on the day of your procedure. Your physician has requested that you go to www.startemmi.com and enter the access code given to you at your visit today. This web site gives a general overview about your procedure. However, you should still follow specific instructions given to you by our office regarding your preparation for the procedure.  CC: Dr Leanna Battles

## 2013-03-17 ENCOUNTER — Encounter: Payer: Self-pay | Admitting: Internal Medicine

## 2013-05-12 ENCOUNTER — Ambulatory Visit (AMBULATORY_SURGERY_CENTER): Payer: Medicare Other | Admitting: Internal Medicine

## 2013-05-12 ENCOUNTER — Encounter: Payer: Self-pay | Admitting: Internal Medicine

## 2013-05-12 VITALS — BP 129/74 | HR 50 | Temp 96.8°F | Resp 16 | Ht 67.0 in | Wt 181.0 lb

## 2013-05-12 DIAGNOSIS — Z1211 Encounter for screening for malignant neoplasm of colon: Secondary | ICD-10-CM

## 2013-05-12 MED ORDER — SODIUM CHLORIDE 0.9 % IV SOLN: 500.0000 mL | INTRAVENOUS | Status: DC

## 2013-05-12 NOTE — Progress Notes (Signed)
Report to pacu rn, vss, bbs=clear 

## 2013-05-12 NOTE — Op Note (Signed)
[place]  [address] [place], [id number]   COLONOSCOPY PROCEDURE REPORT  PATIENT: Craig [name], [place]  MR#: [id number] BIRTHDATE: 10-31-1931 , 81  yrs. old GENDER: Male ENDOSCOPIST: Lafayette Dragon, MD REFERRED GM:WNUUVO Philip Aspen, M.D. PROCEDURE DATE:  05/12/2013 PROCEDURE:   Colonoscopy, screening First Screening Colonoscopy - Avg.  risk and is 50 yrs.  old or older - No.  Prior Negative Screening - Now for repeat screening. 10 or more years since last screening  History of Adenoma - Now for follow-up colonoscopy & has been > or = to 3 yrs.  N/A  Polyps Removed Today? No.  Recommend repeat exam, <10 yrs? No. ASA CLASS:   Class II INDICATIONS:Average risk patient for colon cancer and llast exam in 2001 by Dr Lajoyce Corners. MEDICATIONS: MAC sedation, administered by CRNA and propofol (Diprivan) 250mg  IV  DESCRIPTION OF PROCEDURE:   After the risks benefits and alternatives of the procedure were thoroughly explained, informed consent was obtained.  A digital rectal exam revealed no abnormalities of the rectum.   The LB CF-H180AL Loaner [id number] endoscope was introduced through the anus and advanced to the cecum, which was identified by both the appendix and ileocecal valve. No adverse events experienced.   The quality of the prep was excellent, using MoviPrep  The instrument was then slowly withdrawn as the colon was fully examined.      COLON FINDINGS: There was moderate diverticulosis noted in the sigmoid colon with associated tortuosity, muscular hypertrophy and angulation.   Small internal hemorrhoids were found.  Retroflexed views revealed no abnormalities. The time to cecum=542 minutes 0 seconds.  Withdrawal time=8 minutes 24 seconds.  The scope was withdrawn and the procedure completed. COMPLICATIONS: There were no complications.  ENDOSCOPIC IMPRESSION: 1.   There was moderate diverticulosis noted in the sigmoid colon 2.   Small internal  hemorrhoids  RECOMMENDATIONS: high fiber diet No recall colonoscopy   eSigned:  Lafayette Dragon, MD 05/12/2013 2:21 PM   cc:   PATIENT NAME:  [name], [name] MR#: [id number]

## 2013-05-12 NOTE — Patient Instructions (Signed)
YOU HAD AN ENDOSCOPIC PROCEDURE TODAY AT THE [facility]: Refer to the procedure report that was given to you for any specific questions about what was found during the examination.  If the procedure report does not answer your questions, please call your gastroenterologist to clarify.  If you requested that your care partner not be given the details of your procedure findings, then the procedure report has been included in a sealed envelope for you to review at your convenience later.  YOU SHOULD EXPECT: Some feelings of bloating in the abdomen. Passage of more gas than usual.  Walking can help get rid of the air that was put into your GI tract during the procedure and reduce the bloating. If you had a lower endoscopy (such as a colonoscopy or flexible sigmoidoscopy) you may notice spotting of blood in your stool or on the toilet paper. If you underwent a bowel prep for your procedure, then you may not have a normal bowel movement for a few days.  DIET: Your first meal following the procedure should be a light meal and then it is ok to progress to your normal diet.  A half-sandwich or bowl of soup is an example of a good first meal.  Heavy or fried foods are harder to digest and may make you feel nauseous or bloated.  Likewise meals heavy in dairy and vegetables can cause extra gas to form and this can also increase the bloating.  Drink plenty of fluids but you should avoid alcoholic beverages for 24 hours.  ACTIVITY: Your care partner should take you home directly after the procedure.  You should plan to take it easy, moving slowly for the rest of the day.  You can resume normal activity the day after the procedure however you should NOT DRIVE or use heavy machinery for 24 hours (because of the sedation medicines used during the test).    SYMPTOMS TO REPORT IMMEDIATELY: A gastroenterologist can be reached at any hour.  During normal business hours, 8:30 AM to 5:00 PM Monday through Friday,  call [phone].  After hours and on weekends, please call the GI answering service at [phone] who will take a message and have the physician on call contact you.   Following lower endoscopy (colonoscopy or flexible sigmoidoscopy):  Excessive amounts of blood in the stool  Significant tenderness or worsening of abdominal pains  Swelling of the abdomen that is new, acute  Fever of 100F or higher    FOLLOW UP: If any biopsies were taken you will be contacted by phone or by letter within the next 1-3 weeks.  Call your gastroenterologist if you have not heard about the biopsies in 3 weeks.  Our staff will call the home number listed on your records the next business day following your procedure to check on you and address any questions or concerns that you may have at that time regarding the information given to you following your procedure. This is a courtesy call and so if there is no answer at the home number and we have not heard from you through the emergency physician on call, we will assume that you have returned to your regular daily activities without incident.  SIGNATURES/CONFIDENTIALITY: You and/or your care partner have signed paperwork which will be entered into your electronic medical record.  These signatures attest to the fact that that the information above on your After Visit Summary has been reviewed and is understood.  Full responsibility of the confidentiality   of this discharge information lies with you and/or your care-partner.     

## 2013-05-13 ENCOUNTER — Telehealth: Payer: Self-pay

## 2013-05-13 NOTE — Telephone Encounter (Signed)
  Follow up Call-  Call back number 05/12/2013  Post procedure Call Back phone  # [phone], [phone]  Permission to leave phone message Yes     Patient questions:  Do you have a fever, pain , or abdominal swelling? no Pain Score  0 *  Have you tolerated food without any problems? yes  Have you been able to return to your normal activities? yes  Do you have any questions about your discharge instructions: Diet   no Medications  no Follow up visit  no  Do you have questions or concerns about your Care? no  Actions: * If pain score is 4 or above: No action needed, pain <4.

## 2014-06-03 ENCOUNTER — Ambulatory Visit: Payer: Medicare Other | Admitting: Podiatry

## 2014-06-20 ENCOUNTER — Ambulatory Visit (INDEPENDENT_AMBULATORY_CARE_PROVIDER_SITE_OTHER): Payer: Medicare Other | Admitting: Podiatry

## 2014-06-20 ENCOUNTER — Encounter: Payer: Self-pay | Admitting: Podiatry

## 2014-06-20 VITALS — BP 157/84 | HR 64 | Ht 67.7 in | Wt 172.0 lb

## 2014-06-20 DIAGNOSIS — B351 Tinea unguium: Secondary | ICD-10-CM | POA: Diagnosis not present

## 2014-06-20 NOTE — Progress Notes (Signed)
   Subjective:    Patient ID: Craig [name], male    DOB: [birth year], 81 y.o.   MRN: [id number]  HPI Comments: N debridement L 10 toenails D and O long-term C elongated, thickened  A difficulty to trim T none lately      Review of Systems  All other systems reviewed and are negative.      Objective:   Physical Exam  Orientated 3  Vascular: DP and PT pulses 2/4 bilaterally Capillary reflex immediate bilaterally  Neurological: Ankle reflex equal and reactive bilaterally Sensation to 10 g monofilament wire intact 5/5 bilaterally Vibratory sensation intact bilaterally  Dermatological: Texture and turgor adequate bilaterally The nails have texture and color changes with hypertrophy in hallux and second toenails bilaterally  Musculoskeletal: Taylor's bunion bilaterally Hallux malleus bilaterally Mallet toe third right Hammertoe second left      Assessment & Plan:   Assessment: Satisfactory neurovascular status Mycotic toenails 6-10  Plan: Debridement of toenails 10 without any bleeding  Reappoint when necessary or at three-month intervals

## 2014-09-23 ENCOUNTER — Encounter: Payer: Self-pay | Admitting: Podiatry

## 2014-09-23 ENCOUNTER — Ambulatory Visit (INDEPENDENT_AMBULATORY_CARE_PROVIDER_SITE_OTHER): Payer: Medicare Other | Admitting: Podiatry

## 2014-09-23 VITALS — BP 139/75 | HR 54 | Resp 17

## 2014-09-23 DIAGNOSIS — B351 Tinea unguium: Secondary | ICD-10-CM

## 2014-09-23 DIAGNOSIS — M79676 Pain in unspecified toe(s): Secondary | ICD-10-CM | POA: Diagnosis not present

## 2014-09-23 NOTE — Progress Notes (Signed)
Subjective:     Patient ID: Craig [name], male   DOB: [birth year], 81 y.o.   MRN: [id number]  HPIThis patient presents to the office for preventive foot care services.  He says the nails have grown thick and long since his last visit. He presents for preventive footcare services.    Review of Systems     Objective:   Physical Exam GENERAL APPEARANCE: Alert, conversant. Appropriately groomed. No acute distress.  VASCULAR: Pedal pulses palpable at 2/4 DP and PT bilateral.  Capillary refill time is immediate to all digits,  Proximal to distal cooling it warm to warm.  Digital hair growth is present bilateral  NEUROLOGIC: sensation is intact epicritically and protectively to 5.07 monofilament at 5/5 sites bilateral.  Light touch is intact bilateral, vibratory sensation intact bilateral, achilles tendon reflex is intact bilateral.  MUSCULOSKELETAL: acceptable muscle strength, tone and stability bilateral.  Intrinsic muscluature intact bilateral.  Rectus appearance of foot and digits noted bilateral. There are multiples cysts 5th MPG left, IPJ B/L and at base fifth metatarsal right foot.  These lesions are asymptomatic.  DERMATOLOGIC: skin color, texture, and turgor are within normal limits.  No preulcerative lesions or ulcers  are seen, no interdigital maceration noted.  No open lesions present.  . No drainage noted.NAILS  Thick disfigured discolored nails.      Assessment:     Onychomycosis B/L     Plan:     Debridement of Mycotic Nails.  RTC  3 months

## 2014-12-22 ENCOUNTER — Ambulatory Visit: Payer: Medicare Other | Admitting: Podiatry

## 2015-05-09 DIAGNOSIS — G5602 Carpal tunnel syndrome, left upper limb: Secondary | ICD-10-CM | POA: Diagnosis not present

## 2015-06-13 DIAGNOSIS — M9902 Segmental and somatic dysfunction of thoracic region: Secondary | ICD-10-CM | POA: Diagnosis not present

## 2015-06-13 DIAGNOSIS — M4003 Postural kyphosis, cervicothoracic region: Secondary | ICD-10-CM | POA: Diagnosis not present

## 2015-06-13 DIAGNOSIS — M9901 Segmental and somatic dysfunction of cervical region: Secondary | ICD-10-CM | POA: Diagnosis not present

## 2015-06-13 DIAGNOSIS — M50322 Other cervical disc degeneration at C5-C6 level: Secondary | ICD-10-CM | POA: Diagnosis not present

## 2015-06-13 DIAGNOSIS — M5412 Radiculopathy, cervical region: Secondary | ICD-10-CM | POA: Diagnosis not present

## 2015-06-13 DIAGNOSIS — M50323 Other cervical disc degeneration at C6-C7 level: Secondary | ICD-10-CM | POA: Diagnosis not present

## 2015-07-19 DIAGNOSIS — Z6829 Body mass index (BMI) 29.0-29.9, adult: Secondary | ICD-10-CM | POA: Diagnosis not present

## 2015-07-19 DIAGNOSIS — J209 Acute bronchitis, unspecified: Secondary | ICD-10-CM | POA: Diagnosis not present

## 2015-09-04 DIAGNOSIS — L570 Actinic keratosis: Secondary | ICD-10-CM | POA: Diagnosis not present

## 2015-09-04 DIAGNOSIS — Z85828 Personal history of other malignant neoplasm of skin: Secondary | ICD-10-CM | POA: Diagnosis not present

## 2015-09-19 DIAGNOSIS — G5602 Carpal tunnel syndrome, left upper limb: Secondary | ICD-10-CM | POA: Diagnosis not present

## 2016-01-15 DIAGNOSIS — G5602 Carpal tunnel syndrome, left upper limb: Secondary | ICD-10-CM | POA: Diagnosis not present

## 2016-01-16 DIAGNOSIS — H6123 Impacted cerumen, bilateral: Secondary | ICD-10-CM | POA: Diagnosis not present

## 2016-01-16 DIAGNOSIS — H938X3 Other specified disorders of ear, bilateral: Secondary | ICD-10-CM | POA: Diagnosis not present

## 2016-01-16 DIAGNOSIS — H9193 Unspecified hearing loss, bilateral: Secondary | ICD-10-CM | POA: Diagnosis not present

## 2016-01-29 DIAGNOSIS — L570 Actinic keratosis: Secondary | ICD-10-CM | POA: Diagnosis not present

## 2016-01-29 DIAGNOSIS — L821 Other seborrheic keratosis: Secondary | ICD-10-CM | POA: Diagnosis not present

## 2016-01-29 DIAGNOSIS — E784 Other hyperlipidemia: Secondary | ICD-10-CM | POA: Diagnosis not present

## 2016-01-29 DIAGNOSIS — Z125 Encounter for screening for malignant neoplasm of prostate: Secondary | ICD-10-CM | POA: Diagnosis not present

## 2016-01-29 DIAGNOSIS — D1801 Hemangioma of skin and subcutaneous tissue: Secondary | ICD-10-CM | POA: Diagnosis not present

## 2016-01-29 DIAGNOSIS — R030 Elevated blood-pressure reading, without diagnosis of hypertension: Secondary | ICD-10-CM | POA: Diagnosis not present

## 2016-01-29 DIAGNOSIS — Z85828 Personal history of other malignant neoplasm of skin: Secondary | ICD-10-CM | POA: Diagnosis not present

## 2016-02-05 DIAGNOSIS — E784 Other hyperlipidemia: Secondary | ICD-10-CM | POA: Diagnosis not present

## 2016-02-05 DIAGNOSIS — Z6829 Body mass index (BMI) 29.0-29.9, adult: Secondary | ICD-10-CM | POA: Diagnosis not present

## 2016-02-05 DIAGNOSIS — M25512 Pain in left shoulder: Secondary | ICD-10-CM | POA: Diagnosis not present

## 2016-02-05 DIAGNOSIS — Z0000 Encounter for general adult medical examination without abnormal findings: Secondary | ICD-10-CM | POA: Diagnosis not present

## 2016-02-05 DIAGNOSIS — Z1389 Encounter for screening for other disorder: Secondary | ICD-10-CM | POA: Diagnosis not present

## 2016-02-05 DIAGNOSIS — M25511 Pain in right shoulder: Secondary | ICD-10-CM | POA: Diagnosis not present

## 2016-02-05 DIAGNOSIS — I6523 Occlusion and stenosis of bilateral carotid arteries: Secondary | ICD-10-CM | POA: Diagnosis not present

## 2016-03-20 DIAGNOSIS — Z6829 Body mass index (BMI) 29.0-29.9, adult: Secondary | ICD-10-CM | POA: Diagnosis not present

## 2016-03-20 DIAGNOSIS — R05 Cough: Secondary | ICD-10-CM | POA: Diagnosis not present

## 2016-03-20 DIAGNOSIS — R490 Dysphonia: Secondary | ICD-10-CM | POA: Diagnosis not present

## 2016-03-20 DIAGNOSIS — J019 Acute sinusitis, unspecified: Secondary | ICD-10-CM | POA: Diagnosis not present

## 2016-03-25 DIAGNOSIS — R05 Cough: Secondary | ICD-10-CM | POA: Diagnosis not present

## 2016-03-25 DIAGNOSIS — Z6828 Body mass index (BMI) 28.0-28.9, adult: Secondary | ICD-10-CM | POA: Diagnosis not present

## 2016-03-25 DIAGNOSIS — M79641 Pain in right hand: Secondary | ICD-10-CM | POA: Diagnosis not present

## 2016-03-25 DIAGNOSIS — M79642 Pain in left hand: Secondary | ICD-10-CM | POA: Diagnosis not present

## 2016-04-17 DIAGNOSIS — H4720 Unspecified optic atrophy: Secondary | ICD-10-CM | POA: Diagnosis not present

## 2016-04-17 DIAGNOSIS — Z961 Presence of intraocular lens: Secondary | ICD-10-CM | POA: Diagnosis not present

## 2016-04-17 DIAGNOSIS — H47011 Ischemic optic neuropathy, right eye: Secondary | ICD-10-CM | POA: Diagnosis not present

## 2016-06-10 DIAGNOSIS — Z961 Presence of intraocular lens: Secondary | ICD-10-CM | POA: Diagnosis not present

## 2016-06-10 DIAGNOSIS — H0015 Chalazion left lower eyelid: Secondary | ICD-10-CM | POA: Diagnosis not present

## 2016-06-21 DIAGNOSIS — G5602 Carpal tunnel syndrome, left upper limb: Secondary | ICD-10-CM | POA: Diagnosis not present

## 2016-07-16 DIAGNOSIS — H0015 Chalazion left lower eyelid: Secondary | ICD-10-CM | POA: Diagnosis not present

## 2016-07-16 DIAGNOSIS — Z961 Presence of intraocular lens: Secondary | ICD-10-CM | POA: Diagnosis not present

## 2016-09-02 DIAGNOSIS — H6123 Impacted cerumen, bilateral: Secondary | ICD-10-CM | POA: Diagnosis not present

## 2016-09-02 DIAGNOSIS — H903 Sensorineural hearing loss, bilateral: Secondary | ICD-10-CM | POA: Diagnosis not present

## 2016-09-23 DIAGNOSIS — L570 Actinic keratosis: Secondary | ICD-10-CM | POA: Diagnosis not present

## 2016-09-24 DIAGNOSIS — Z85828 Personal history of other malignant neoplasm of skin: Secondary | ICD-10-CM | POA: Diagnosis not present

## 2016-09-24 DIAGNOSIS — D1801 Hemangioma of skin and subcutaneous tissue: Secondary | ICD-10-CM | POA: Diagnosis not present

## 2016-09-24 DIAGNOSIS — L821 Other seborrheic keratosis: Secondary | ICD-10-CM | POA: Diagnosis not present

## 2016-09-24 DIAGNOSIS — L570 Actinic keratosis: Secondary | ICD-10-CM | POA: Diagnosis not present

## 2016-10-24 DIAGNOSIS — G5602 Carpal tunnel syndrome, left upper limb: Secondary | ICD-10-CM | POA: Diagnosis not present

## 2017-01-16 DIAGNOSIS — E7849 Other hyperlipidemia: Secondary | ICD-10-CM | POA: Diagnosis not present

## 2017-01-16 DIAGNOSIS — Z1389 Encounter for screening for other disorder: Secondary | ICD-10-CM | POA: Diagnosis not present

## 2017-01-16 DIAGNOSIS — J019 Acute sinusitis, unspecified: Secondary | ICD-10-CM | POA: Diagnosis not present

## 2017-01-16 DIAGNOSIS — R05 Cough: Secondary | ICD-10-CM | POA: Diagnosis not present

## 2017-01-16 DIAGNOSIS — Z6829 Body mass index (BMI) 29.0-29.9, adult: Secondary | ICD-10-CM | POA: Diagnosis not present

## 2017-01-31 DIAGNOSIS — R82998 Other abnormal findings in urine: Secondary | ICD-10-CM | POA: Diagnosis not present

## 2017-01-31 DIAGNOSIS — Z125 Encounter for screening for malignant neoplasm of prostate: Secondary | ICD-10-CM | POA: Diagnosis not present

## 2017-01-31 DIAGNOSIS — E7849 Other hyperlipidemia: Secondary | ICD-10-CM | POA: Diagnosis not present

## 2017-02-07 DIAGNOSIS — E7849 Other hyperlipidemia: Secondary | ICD-10-CM | POA: Diagnosis not present

## 2017-02-07 DIAGNOSIS — I6523 Occlusion and stenosis of bilateral carotid arteries: Secondary | ICD-10-CM | POA: Diagnosis not present

## 2017-02-07 DIAGNOSIS — H5461 Unqualified visual loss, right eye, normal vision left eye: Secondary | ICD-10-CM | POA: Diagnosis not present

## 2017-02-07 DIAGNOSIS — Z0000 Encounter for general adult medical examination without abnormal findings: Secondary | ICD-10-CM | POA: Diagnosis not present

## 2017-02-07 DIAGNOSIS — Z23 Encounter for immunization: Secondary | ICD-10-CM | POA: Diagnosis not present

## 2017-02-07 DIAGNOSIS — M79642 Pain in left hand: Secondary | ICD-10-CM | POA: Diagnosis not present

## 2017-02-07 DIAGNOSIS — Z6826 Body mass index (BMI) 26.0-26.9, adult: Secondary | ICD-10-CM | POA: Diagnosis not present

## 2017-02-07 DIAGNOSIS — M79641 Pain in right hand: Secondary | ICD-10-CM | POA: Diagnosis not present

## 2017-02-07 DIAGNOSIS — Z1389 Encounter for screening for other disorder: Secondary | ICD-10-CM | POA: Diagnosis not present

## 2017-03-10 ENCOUNTER — Other Ambulatory Visit (HOSPITAL_COMMUNITY): Payer: Self-pay | Admitting: Internal Medicine

## 2017-03-10 DIAGNOSIS — I6523 Occlusion and stenosis of bilateral carotid arteries: Secondary | ICD-10-CM

## 2017-03-12 ENCOUNTER — Ambulatory Visit (HOSPITAL_COMMUNITY)
Admission: RE | Admit: 2017-03-12 | Discharge: 2017-03-12 | Disposition: A | Discharge status: Home / Self Care | Payer: PPO | Source: Ambulatory Visit | Attending: Vascular Surgery | Admitting: Vascular Surgery

## 2017-03-12 DIAGNOSIS — I6523 Occlusion and stenosis of bilateral carotid arteries: Secondary | ICD-10-CM | POA: Diagnosis not present

## 2017-03-12 LAB — VAS US CAROTID
LCCADDIAS: -19 cm/s
LCCADSYS: -105 cm/s
LCCAPDIAS: 25 cm/s
LEFT ECA DIAS: -26 cm/s
LEFT VERTEBRAL DIAS: -13 cm/s
LICADDIAS: -35 cm/s
LICAPSYS: 309 cm/s
Left CCA prox sys: 118 cm/s
Left ICA dist sys: -118 cm/s
Left ICA prox dias: 69 cm/s
RCCAPSYS: 122 cm/s
RIGHT CCA MID DIAS: 22 cm/s
RIGHT ECA DIAS: -26 cm/s
RIGHT VERTEBRAL DIAS: -9 cm/s
Right CCA prox dias: 22 cm/s
Right cca dist sys: -62 cm/s

## 2017-03-14 DIAGNOSIS — M79645 Pain in left finger(s): Secondary | ICD-10-CM | POA: Diagnosis not present

## 2017-03-14 DIAGNOSIS — S62621A Displaced fracture of medial phalanx of left index finger, initial encounter for closed fracture: Secondary | ICD-10-CM | POA: Diagnosis not present

## 2017-03-14 DIAGNOSIS — M20012 Mallet finger of left finger(s): Secondary | ICD-10-CM | POA: Diagnosis not present

## 2017-03-28 DIAGNOSIS — S62623D Displaced fracture of medial phalanx of left middle finger, subsequent encounter for fracture with routine healing: Secondary | ICD-10-CM | POA: Diagnosis not present

## 2017-04-02 DIAGNOSIS — S66313A Strain of extensor muscle, fascia and tendon of left middle finger at wrist and hand level, initial encounter: Secondary | ICD-10-CM | POA: Diagnosis not present

## 2017-04-02 DIAGNOSIS — X58XXXA Exposure to other specified factors, initial encounter: Secondary | ICD-10-CM | POA: Diagnosis not present

## 2017-04-02 DIAGNOSIS — S62623A Displaced fracture of medial phalanx of left middle finger, initial encounter for closed fracture: Secondary | ICD-10-CM | POA: Diagnosis not present

## 2017-04-02 DIAGNOSIS — Y999 Unspecified external cause status: Secondary | ICD-10-CM | POA: Diagnosis not present

## 2017-04-02 DIAGNOSIS — M20022 Boutonniere deformity of left finger(s): Secondary | ICD-10-CM | POA: Diagnosis not present

## 2017-04-02 DIAGNOSIS — M10442 Other secondary gout, left hand: Secondary | ICD-10-CM | POA: Diagnosis not present

## 2017-04-02 DIAGNOSIS — M13142 Monoarthritis, not elsewhere classified, left hand: Secondary | ICD-10-CM | POA: Diagnosis not present

## 2017-04-02 DIAGNOSIS — M11842 Other specified crystal arthropathies, left hand: Secondary | ICD-10-CM | POA: Diagnosis not present

## 2017-04-11 DIAGNOSIS — M79645 Pain in left finger(s): Secondary | ICD-10-CM | POA: Diagnosis not present

## 2017-04-11 DIAGNOSIS — S62623D Displaced fracture of medial phalanx of left middle finger, subsequent encounter for fracture with routine healing: Secondary | ICD-10-CM | POA: Diagnosis not present

## 2017-04-17 DIAGNOSIS — Z961 Presence of intraocular lens: Secondary | ICD-10-CM | POA: Diagnosis not present

## 2017-04-17 DIAGNOSIS — H47011 Ischemic optic neuropathy, right eye: Secondary | ICD-10-CM | POA: Diagnosis not present

## 2017-04-18 DIAGNOSIS — S62621D Displaced fracture of medial phalanx of left index finger, subsequent encounter for fracture with routine healing: Secondary | ICD-10-CM | POA: Diagnosis not present

## 2017-04-18 DIAGNOSIS — S62621A Displaced fracture of medial phalanx of left index finger, initial encounter for closed fracture: Secondary | ICD-10-CM | POA: Diagnosis not present

## 2017-04-18 DIAGNOSIS — Z4789 Encounter for other orthopedic aftercare: Secondary | ICD-10-CM | POA: Diagnosis not present

## 2017-05-01 DIAGNOSIS — S62623D Displaced fracture of medial phalanx of left middle finger, subsequent encounter for fracture with routine healing: Secondary | ICD-10-CM | POA: Diagnosis not present

## 2017-05-01 DIAGNOSIS — Z4789 Encounter for other orthopedic aftercare: Secondary | ICD-10-CM | POA: Diagnosis not present

## 2017-05-19 DIAGNOSIS — S62623D Displaced fracture of medial phalanx of left middle finger, subsequent encounter for fracture with routine healing: Secondary | ICD-10-CM | POA: Diagnosis not present

## 2017-06-30 DIAGNOSIS — H6123 Impacted cerumen, bilateral: Secondary | ICD-10-CM | POA: Diagnosis not present

## 2017-06-30 DIAGNOSIS — J302 Other seasonal allergic rhinitis: Secondary | ICD-10-CM | POA: Diagnosis not present

## 2017-06-30 DIAGNOSIS — H9193 Unspecified hearing loss, bilateral: Secondary | ICD-10-CM | POA: Diagnosis not present

## 2017-07-29 DIAGNOSIS — S62623D Displaced fracture of medial phalanx of left middle finger, subsequent encounter for fracture with routine healing: Secondary | ICD-10-CM | POA: Diagnosis not present

## 2017-07-29 DIAGNOSIS — Z4789 Encounter for other orthopedic aftercare: Secondary | ICD-10-CM | POA: Diagnosis not present

## 2017-09-02 DIAGNOSIS — M109 Gout, unspecified: Secondary | ICD-10-CM | POA: Diagnosis not present

## 2017-09-02 DIAGNOSIS — E7849 Other hyperlipidemia: Secondary | ICD-10-CM | POA: Diagnosis not present

## 2017-09-02 DIAGNOSIS — I6523 Occlusion and stenosis of bilateral carotid arteries: Secondary | ICD-10-CM | POA: Diagnosis not present

## 2017-09-02 DIAGNOSIS — R0789 Other chest pain: Secondary | ICD-10-CM | POA: Diagnosis not present

## 2017-09-02 DIAGNOSIS — Z6827 Body mass index (BMI) 27.0-27.9, adult: Secondary | ICD-10-CM | POA: Diagnosis not present

## 2017-09-24 DIAGNOSIS — H34231 Retinal artery branch occlusion, right eye: Secondary | ICD-10-CM | POA: Diagnosis not present

## 2017-09-24 DIAGNOSIS — E785 Hyperlipidemia, unspecified: Secondary | ICD-10-CM | POA: Diagnosis not present

## 2017-09-24 DIAGNOSIS — R0789 Other chest pain: Secondary | ICD-10-CM | POA: Diagnosis not present

## 2017-09-24 DIAGNOSIS — I6523 Occlusion and stenosis of bilateral carotid arteries: Secondary | ICD-10-CM | POA: Diagnosis not present

## 2017-09-26 DIAGNOSIS — Z6827 Body mass index (BMI) 27.0-27.9, adult: Secondary | ICD-10-CM | POA: Diagnosis not present

## 2017-09-26 DIAGNOSIS — R0789 Other chest pain: Secondary | ICD-10-CM | POA: Diagnosis not present

## 2017-09-26 DIAGNOSIS — M109 Gout, unspecified: Secondary | ICD-10-CM | POA: Diagnosis not present

## 2017-09-26 DIAGNOSIS — I6523 Occlusion and stenosis of bilateral carotid arteries: Secondary | ICD-10-CM | POA: Diagnosis not present

## 2017-09-26 DIAGNOSIS — E7849 Other hyperlipidemia: Secondary | ICD-10-CM | POA: Diagnosis not present

## 2017-09-29 DIAGNOSIS — I2510 Atherosclerotic heart disease of native coronary artery without angina pectoris: Secondary | ICD-10-CM | POA: Diagnosis not present

## 2017-09-29 DIAGNOSIS — R0789 Other chest pain: Secondary | ICD-10-CM | POA: Diagnosis not present

## 2017-09-30 DIAGNOSIS — Z85828 Personal history of other malignant neoplasm of skin: Secondary | ICD-10-CM | POA: Diagnosis not present

## 2017-09-30 DIAGNOSIS — L570 Actinic keratosis: Secondary | ICD-10-CM | POA: Diagnosis not present

## 2017-09-30 DIAGNOSIS — L821 Other seborrheic keratosis: Secondary | ICD-10-CM | POA: Diagnosis not present

## 2017-09-30 DIAGNOSIS — D1801 Hemangioma of skin and subcutaneous tissue: Secondary | ICD-10-CM | POA: Diagnosis not present

## 2017-10-03 DIAGNOSIS — S62623D Displaced fracture of medial phalanx of left middle finger, subsequent encounter for fracture with routine healing: Secondary | ICD-10-CM | POA: Diagnosis not present

## 2017-10-07 DIAGNOSIS — M79645 Pain in left finger(s): Secondary | ICD-10-CM | POA: Diagnosis not present

## 2017-10-07 DIAGNOSIS — S62623D Displaced fracture of medial phalanx of left middle finger, subsequent encounter for fracture with routine healing: Secondary | ICD-10-CM | POA: Diagnosis not present

## 2017-10-09 DIAGNOSIS — M79645 Pain in left finger(s): Secondary | ICD-10-CM | POA: Diagnosis not present

## 2017-10-10 DIAGNOSIS — M79645 Pain in left finger(s): Secondary | ICD-10-CM | POA: Diagnosis not present

## 2017-10-13 DIAGNOSIS — M79645 Pain in left finger(s): Secondary | ICD-10-CM | POA: Diagnosis not present

## 2017-10-15 DIAGNOSIS — M25642 Stiffness of left hand, not elsewhere classified: Secondary | ICD-10-CM | POA: Diagnosis not present

## 2017-10-17 DIAGNOSIS — M25642 Stiffness of left hand, not elsewhere classified: Secondary | ICD-10-CM | POA: Diagnosis not present

## 2017-10-21 DIAGNOSIS — M79645 Pain in left finger(s): Secondary | ICD-10-CM | POA: Diagnosis not present

## 2017-10-23 DIAGNOSIS — M25642 Stiffness of left hand, not elsewhere classified: Secondary | ICD-10-CM | POA: Diagnosis not present

## 2017-10-30 DIAGNOSIS — S62623D Displaced fracture of medial phalanx of left middle finger, subsequent encounter for fracture with routine healing: Secondary | ICD-10-CM | POA: Diagnosis not present

## 2017-11-05 DIAGNOSIS — I2510 Atherosclerotic heart disease of native coronary artery without angina pectoris: Secondary | ICD-10-CM | POA: Diagnosis not present

## 2017-11-05 DIAGNOSIS — R0789 Other chest pain: Secondary | ICD-10-CM | POA: Diagnosis not present

## 2017-11-05 DIAGNOSIS — Z8669 Personal history of other diseases of the nervous system and sense organs: Secondary | ICD-10-CM | POA: Diagnosis not present

## 2017-11-05 DIAGNOSIS — I6523 Occlusion and stenosis of bilateral carotid arteries: Secondary | ICD-10-CM | POA: Diagnosis not present

## 2017-11-24 DIAGNOSIS — E785 Hyperlipidemia, unspecified: Secondary | ICD-10-CM | POA: Diagnosis not present

## 2017-11-24 DIAGNOSIS — H34231 Retinal artery branch occlusion, right eye: Secondary | ICD-10-CM | POA: Diagnosis not present

## 2017-11-24 DIAGNOSIS — I6523 Occlusion and stenosis of bilateral carotid arteries: Secondary | ICD-10-CM | POA: Diagnosis not present

## 2017-11-24 DIAGNOSIS — R0789 Other chest pain: Secondary | ICD-10-CM | POA: Diagnosis not present

## 2018-01-12 ENCOUNTER — Other Ambulatory Visit: Payer: Self-pay | Admitting: Pharmacist

## 2018-01-12 NOTE — Patient Outreach (Signed)
[facility]) Care Management  01/12/2018  ACHILLE XIANG 1931/10/31 [id number]   Call to follow up with Dr. Shon Baton office to request that they follow up with the patient for clarification about whether the provider wants the patient to take a daily aspirin and whether he is due for his flu shot. Leave a message with DJ in the office requesting a call to the patient for clarification.  Harlow Asa, PharmD, [facility] [phone]

## 2018-01-12 NOTE — Patient Outreach (Signed)
[facility]) Care Management  01/12/2018  KHAIDEN SEGRETO 22-Jul-1931 [id number]   Incoming call from Jake Church in response to the [facility] Medication Adherence Campaign. Speak with patient. HIPAA identifiers verified and verbal consent received.  Mr. Crum reports that he takes his atorvastatin 20 mg daily as directed. Denies any missed doses or barriers to adherence. Counsel patient about the importance of adherence to this medication. Patient verbalizes understanding. Note that patient last had this prescription refilled on 12/30/17 for a 90 day supply.  Mr. Cuadra reports that he sometimes takes a daily aspirin, about once a month, but that he is not sure whether his PCP wanted him to take this daily. States that he also cannot remember whether he had his annual flu shot this season. Patient states that he is going to be stopping by the office later today and will follow up with the office at that time for clarification about the aspirin and his flu shot.  PLAN  1) I will also call to follow up with Dr. Shon Baton office to request that they follow up with the patient for clarification about whether the provider wants the patient to take a daily aspirin and whether he is due for his flu shot.  2) Will close pharmacy episode.  Harlow Asa, PharmD, [facility] [phone]

## 2018-01-21 DIAGNOSIS — M79642 Pain in left hand: Secondary | ICD-10-CM | POA: Diagnosis not present

## 2018-01-23 DIAGNOSIS — Z23 Encounter for immunization: Secondary | ICD-10-CM | POA: Diagnosis not present

## 2018-01-29 DIAGNOSIS — S62623D Displaced fracture of medial phalanx of left middle finger, subsequent encounter for fracture with routine healing: Secondary | ICD-10-CM | POA: Diagnosis not present

## 2018-01-29 DIAGNOSIS — S60411D Abrasion of left index finger, subsequent encounter: Secondary | ICD-10-CM | POA: Diagnosis not present

## 2018-02-13 DIAGNOSIS — M109 Gout, unspecified: Secondary | ICD-10-CM | POA: Diagnosis not present

## 2018-02-13 DIAGNOSIS — Z125 Encounter for screening for malignant neoplasm of prostate: Secondary | ICD-10-CM | POA: Diagnosis not present

## 2018-02-13 DIAGNOSIS — E7849 Other hyperlipidemia: Secondary | ICD-10-CM | POA: Diagnosis not present

## 2018-02-19 DIAGNOSIS — E7849 Other hyperlipidemia: Secondary | ICD-10-CM | POA: Diagnosis not present

## 2018-02-19 DIAGNOSIS — R413 Other amnesia: Secondary | ICD-10-CM | POA: Diagnosis not present

## 2018-02-19 DIAGNOSIS — Z6828 Body mass index (BMI) 28.0-28.9, adult: Secondary | ICD-10-CM | POA: Diagnosis not present

## 2018-02-19 DIAGNOSIS — I6523 Occlusion and stenosis of bilateral carotid arteries: Secondary | ICD-10-CM | POA: Diagnosis not present

## 2018-02-19 DIAGNOSIS — I10 Essential (primary) hypertension: Secondary | ICD-10-CM | POA: Diagnosis not present

## 2018-02-19 DIAGNOSIS — Z1389 Encounter for screening for other disorder: Secondary | ICD-10-CM | POA: Diagnosis not present

## 2018-02-19 DIAGNOSIS — L538 Other specified erythematous conditions: Secondary | ICD-10-CM | POA: Diagnosis not present

## 2018-02-19 DIAGNOSIS — Z0000 Encounter for general adult medical examination without abnormal findings: Secondary | ICD-10-CM | POA: Diagnosis not present

## 2018-02-19 DIAGNOSIS — H9193 Unspecified hearing loss, bilateral: Secondary | ICD-10-CM | POA: Diagnosis not present

## 2018-02-26 DIAGNOSIS — Z1212 Encounter for screening for malignant neoplasm of rectum: Secondary | ICD-10-CM | POA: Diagnosis not present

## 2018-03-17 DIAGNOSIS — S62623D Displaced fracture of medial phalanx of left middle finger, subsequent encounter for fracture with routine healing: Secondary | ICD-10-CM | POA: Diagnosis not present

## 2018-04-01 ENCOUNTER — Other Ambulatory Visit: Payer: Self-pay | Admitting: Cardiology

## 2018-04-01 DIAGNOSIS — I6523 Occlusion and stenosis of bilateral carotid arteries: Secondary | ICD-10-CM

## 2018-04-14 DIAGNOSIS — R030 Elevated blood-pressure reading, without diagnosis of hypertension: Secondary | ICD-10-CM | POA: Diagnosis not present

## 2018-04-15 DIAGNOSIS — R030 Elevated blood-pressure reading, without diagnosis of hypertension: Secondary | ICD-10-CM | POA: Diagnosis not present

## 2018-04-20 DIAGNOSIS — H6122 Impacted cerumen, left ear: Secondary | ICD-10-CM | POA: Diagnosis not present

## 2018-04-20 DIAGNOSIS — H6123 Impacted cerumen, bilateral: Secondary | ICD-10-CM | POA: Diagnosis not present

## 2018-04-20 DIAGNOSIS — H903 Sensorineural hearing loss, bilateral: Secondary | ICD-10-CM | POA: Diagnosis not present

## 2018-04-20 DIAGNOSIS — H6121 Impacted cerumen, right ear: Secondary | ICD-10-CM | POA: Diagnosis not present

## 2018-05-06 ENCOUNTER — Other Ambulatory Visit: Payer: Self-pay

## 2018-05-13 ENCOUNTER — Ambulatory Visit: Payer: Self-pay | Admitting: Cardiology

## 2018-06-02 DIAGNOSIS — H524 Presbyopia: Secondary | ICD-10-CM | POA: Diagnosis not present

## 2018-06-02 DIAGNOSIS — S62623D Displaced fracture of medial phalanx of left middle finger, subsequent encounter for fracture with routine healing: Secondary | ICD-10-CM | POA: Diagnosis not present

## 2018-06-02 DIAGNOSIS — H5203 Hypermetropia, bilateral: Secondary | ICD-10-CM | POA: Diagnosis not present

## 2018-06-02 DIAGNOSIS — H538 Other visual disturbances: Secondary | ICD-10-CM | POA: Diagnosis not present

## 2018-06-02 DIAGNOSIS — Z961 Presence of intraocular lens: Secondary | ICD-10-CM | POA: Diagnosis not present

## 2018-06-15 ENCOUNTER — Ambulatory Visit: Payer: Self-pay | Admitting: Cardiology

## 2018-08-11 DIAGNOSIS — S60413D Abrasion of left middle finger, subsequent encounter: Secondary | ICD-10-CM | POA: Diagnosis not present

## 2018-08-14 DIAGNOSIS — S60413D Abrasion of left middle finger, subsequent encounter: Secondary | ICD-10-CM | POA: Diagnosis not present

## 2018-08-18 DIAGNOSIS — E785 Hyperlipidemia, unspecified: Secondary | ICD-10-CM | POA: Diagnosis not present

## 2018-08-18 DIAGNOSIS — Z1331 Encounter for screening for depression: Secondary | ICD-10-CM | POA: Diagnosis not present

## 2018-08-18 DIAGNOSIS — H9193 Unspecified hearing loss, bilateral: Secondary | ICD-10-CM | POA: Diagnosis not present

## 2018-08-18 DIAGNOSIS — R413 Other amnesia: Secondary | ICD-10-CM | POA: Diagnosis not present

## 2018-09-30 ENCOUNTER — Ambulatory Visit: Payer: PPO | Admitting: Neurology

## 2018-10-06 DIAGNOSIS — D1801 Hemangioma of skin and subcutaneous tissue: Secondary | ICD-10-CM | POA: Diagnosis not present

## 2018-10-06 DIAGNOSIS — L570 Actinic keratosis: Secondary | ICD-10-CM | POA: Diagnosis not present

## 2018-10-06 DIAGNOSIS — Z85828 Personal history of other malignant neoplasm of skin: Secondary | ICD-10-CM | POA: Diagnosis not present

## 2018-10-06 DIAGNOSIS — L821 Other seborrheic keratosis: Secondary | ICD-10-CM | POA: Diagnosis not present

## 2018-10-27 ENCOUNTER — Ambulatory Visit: Payer: PPO | Admitting: Diagnostic Neuroimaging

## 2018-11-19 ENCOUNTER — Encounter: Payer: Self-pay | Admitting: *Deleted

## 2018-11-24 ENCOUNTER — Ambulatory Visit: Payer: PPO | Admitting: Diagnostic Neuroimaging

## 2018-12-10 DIAGNOSIS — Z23 Encounter for immunization: Secondary | ICD-10-CM | POA: Diagnosis not present

## 2018-12-11 ENCOUNTER — Other Ambulatory Visit: Payer: Self-pay

## 2018-12-11 ENCOUNTER — Ambulatory Visit (INDEPENDENT_AMBULATORY_CARE_PROVIDER_SITE_OTHER): Payer: PPO

## 2018-12-11 DIAGNOSIS — I6523 Occlusion and stenosis of bilateral carotid arteries: Secondary | ICD-10-CM

## 2018-12-14 ENCOUNTER — Other Ambulatory Visit: Payer: Self-pay | Admitting: Cardiology

## 2018-12-14 DIAGNOSIS — I6523 Occlusion and stenosis of bilateral carotid arteries: Secondary | ICD-10-CM

## 2018-12-14 NOTE — Progress Notes (Signed)
Bilateral carotid stenosis slight progression right 50-69% stenosis bilateral. Recheck in 6 months. Needs OV to f/u and to bring labs from PCP please

## 2018-12-14 NOTE — Progress Notes (Signed)
Pt aware and will schedule appt 

## 2018-12-16 ENCOUNTER — Encounter: Payer: Self-pay | Admitting: Cardiology

## 2018-12-17 ENCOUNTER — Ambulatory Visit: Payer: PPO | Admitting: Cardiology

## 2019-01-01 ENCOUNTER — Ambulatory Visit (INDEPENDENT_AMBULATORY_CARE_PROVIDER_SITE_OTHER): Payer: PPO | Admitting: Cardiology

## 2019-01-01 ENCOUNTER — Other Ambulatory Visit: Payer: Self-pay

## 2019-01-01 ENCOUNTER — Encounter: Payer: Self-pay | Admitting: Cardiology

## 2019-01-01 VITALS — BP 136/76 | HR 63 | Temp 97.4°F | Ht 67.7 in | Wt 166.0 lb

## 2019-01-01 DIAGNOSIS — I6523 Occlusion and stenosis of bilateral carotid arteries: Secondary | ICD-10-CM

## 2019-01-01 DIAGNOSIS — H34231 Retinal artery branch occlusion, right eye: Secondary | ICD-10-CM

## 2019-01-01 DIAGNOSIS — E7800 Pure hypercholesterolemia, unspecified: Secondary | ICD-10-CM | POA: Diagnosis not present

## 2019-01-01 DIAGNOSIS — I350 Nonrheumatic aortic (valve) stenosis: Secondary | ICD-10-CM | POA: Diagnosis not present

## 2019-01-01 NOTE — Progress Notes (Signed)
Primary Physician/Referring:  Craig Battles, MD  Patient ID: Craig [name], male    DOB: 10-18-1931, 85 y.o.   MRN: [id number]  Chief Complaint  Patient presents with  . carotid artery stenosis  . Hyperlipidemia   HPI:    Craig [name]  is a 85 y.o.  Caucasian male with hypertension, hyperlipidemia, retinal artery branch occlusion in December 2013 found to have moderate bilateral carotid artery stenosis, managed medically, hyperlipidemia presents here for follow-up of carotid stenosis.    He is active for his age. He goes for short walks around the park and also does stretches. He has an occasional cigar and he socially drinks.   Past Medical History:  Diagnosis Date  . Allergy    vioxx  . CAD (coronary artery disease)   . Hyperlipidemia    pt states he never had high cholesterol  . Internal hemorrhoids    pt. states he does not have hemorrhoids  . Knee osteoarthritis    pt. states he does not have osteo  . Memory difficulty    Past Surgical History:  Procedure Laterality Date  . CARPAL TUNNEL RELEASE Right 2011  . CATARACT EXTRACTION Bilateral 2012  . mild hearing deficit    . TOTAL KNEE ARTHROPLASTY Left 2009  . TOTAL KNEE ARTHROPLASTY Right 2011  . WRIST FRACTURE SURGERY     Social History   Socioeconomic History  . Marital status: Married    Spouse name: Craig [name]  . Number of children: 4  . Years of education: college  . Highest education level: Not on file  Occupational History    Comment: sells jewelry  Social Needs  . Financial resource strain: Not on file  . Food insecurity    Worry: Not on file    Inability: Not on file  . Transportation needs    Medical: Not on file    Non-medical: Not on file  Tobacco Use  . Smoking status: Never Smoker  . Smokeless tobacco: Never Used  Substance and Sexual Activity  . Alcohol use: Yes    Alcohol/week: 14.0 standard drinks    Types: 14 Standard drinks or equivalent per week    Comment: 1 per day  .  Drug use: No  . Sexual activity: Not on file  Lifestyle  . Physical activity    Days per week: Not on file    Minutes per session: Not on file  . Stress: Not on file  Relationships  . Social Herbalist on phone: Not on file    Gets together: Not on file    Attends religious service: Not on file    Active member of club or organization: Not on file    Attends meetings of clubs or organizations: Not on file    Relationship status: Not on file  . Intimate partner violence    Fear of current or ex partner: Not on file    Emotionally abused: Not on file    Physically abused: Not on file    Forced sexual activity: Not on file  Other Topics Concern  . Not on file  Social History Narrative   Lives with wife   Caffeine- 2 daily   ROS  Review of Systems  Constitution: Negative for chills, decreased appetite, malaise/fatigue and weight gain.  Cardiovascular: Negative for dyspnea on exertion, leg swelling and syncope.  Endocrine: Negative for cold intolerance.  Hematologic/Lymphatic: Does not bruise/bleed easily.  Musculoskeletal: Positive for joint pain and neck pain.  Negative for joint swelling.  Gastrointestinal: Negative for abdominal pain, anorexia, change in bowel habit, hematochezia and melena.  Neurological: Negative for headaches and light-headedness.  Psychiatric/Behavioral: Negative for depression and substance abuse.  All other systems reviewed and are negative.  Objective   Vitals with BMI 01/01/2019 09/23/2014 06/20/2014  Height 5' 7.7" - 5' 7.7"  Weight 166 lbs - 172 lbs  BMI 15.52 - 08.0  Systolic 223 361 224  Diastolic 76 75 84  Pulse 63 54 64      Physical Exam  Constitutional:  He is well-built and well-nourished in no acute distress, appears younger than stated age.  HENT:  Head: Atraumatic.  Eyes: Conjunctivae are normal.  Neck: Neck supple. No JVD present. No thyromegaly present.  Cardiovascular: Normal rate, regular rhythm, intact distal pulses  and normal pulses. Exam reveals no gallop.  Murmur heard.  Harsh midsystolic murmur is present with a grade of 3/6 at the upper right sternal border radiating to the neck. Pulses:      Carotid pulses are on the left side with bruit. No leg edema, no JVD.  Pulmonary/Chest: Effort normal and breath sounds normal.  Abdominal: Soft. Bowel sounds are normal.  Musculoskeletal: Normal range of motion.  Neurological: He is alert.  Skin: Skin is warm and dry.  Psychiatric: He has a normal mood and affect.    Laboratory examination:   09/02/2017: Creatinine 1.0, EGFR 71/86, potassium 4.8, BMP normal.  Cholesterol 203, triglycerides 67, HDL 71, LDL 119.  No results for input(s): NA, K, CL, CO2, GLUCOSE, BUN, CREATININE, CALCIUM, GFRNONAA, GFRAA in the last 8760 hours. CrCl cannot be calculated (Patient's most recent lab result is older than the maximum 21 days allowed.).  CMP Latest Ref Rng & Units 02/07/2012 01/18/2010 01/17/2010  Glucose 70 - 99 mg/dL 92 119(H) 149(H)  BUN 6 - 23 mg/dL _0 Creatinine 0.50 - 1.35 mg/dL 1.10 0.97 0.89  Sodium 135 - 145 mEq/L 143 141 140  Potassium 3.5 - 5.1 mEq/L 3.8 3.8 4.0  Chloride 96 - 112 mEq/L 108 108 107  CO2 19 - 32 mEq/L - 26 26  Calcium 8.4 - 10.5 mg/dL - 8.6 8.6   CBC Latest Ref Rng & Units 02/07/2012 01/19/2010 01/18/2010  WBC 4.0 - 10.5 K/uL - 5.7 6.1  Hemoglobin 13.0 - 17.0 g/dL 13.9 9.2(L) 10.0(L)  Hematocrit 39.0 - 52.0 % 41.0 26.7(L) 29.2(L)  Platelets 150 - 400 K/uL - 112(L) 94 DELTA CHECK NOTED RESULT REPEATED AND VERIFIED SPECIMEN CHECKED FOR CLOTS(L)   Lipid Panel     Component Value Date/Time   CHOL 161 10/15/2006 0814   TRIG 63 10/15/2006 0814   HDL 62.4 10/15/2006 0814   CHOLHDL 2.6 CALC 10/15/2006 0814   VLDL 13 10/15/2006 0814   LDLCALC 86 10/15/2006 0814   HEMOGLOBIN A1C No results found for: HGBA1C, MPG TSH No results for input(s): TSH in the last 8760 hours. Medications and allergies   Allergies  Allergen  Reactions  . Vioxx [Rofecoxib] Rash     Current Outpatient Medications  Medication Instructions  . allopurinol (ZYLOPRIM) 300 MG tablet 1 tablet, Oral  . Apoaequorin 10 mg, Oral, Daily  . aspirin 325 mg, Oral, Daily  . Misc. Devices MISC One touch verio test stripes #[id number]: [id number]  . rosuvastatin (CRESTOR) 10 mg, Oral, Daily    Radiology:  No results found. Cardiac Studies:   Nuclear stress test  09/29/2017: 1. The patient performed treadmill exercise using Bruce protocol, completing 4:58  minutes. The patient completed an estimated workload of 7.0 METS, reaching 102% of the maximum predicted heart rate. Normal hemodynamic response. No stress symptoms reported. Low exercise capacity. Stress electrocardiogram shows 1.5 mm horizontal ST depressions in leads V5,V6, II and upsloping 1-1.5 mm ST depressions in leads III, aVF that normalize within 1 min into recovery. Stress electrocardiogram is equivocal for ischemia. 2. The overall quality of the study is good. There is no evidence of abnormal lung activity. Stress and rest SPECT images demonstrate homogeneous tracer distribution throughout the myocardium. Gated SPECT imaging reveals normal myocardial thickening and wall motion. The left ventricular ejection fraction was normal (72%). 3. Low risk study.  Echocardiogram  11/05/2017: Left ventricle cavity is normal in size. Normal global wall motion. Normal diastolic filling pattern. Calculated EF 61%. Mild calcification of the aortic valve annulus and leaflets. Trace aortic stenosis. No aortic valve regurgitation noted. Mild (Grade I) mitral regurgitation. Mild tricuspid regurgitation. No evidence of pulmonary hypertension.  Carotid artery duplex  12/11/2018: Stenosis in the right internal carotid artery (50-69%). Stenosis in the right external carotid artery (<50%). Stenosis in the left internal carotid artery (50-69%). Antegrade right vertebral artery flow. Antegrade left vertebral artery  flow. No significant change from 11/05/2017, right ICA stenosis minimally progressed. Follow up in six months is appropriate if clinically indicated..  Assessment     ICD-10-CM   1. Bilateral carotid artery stenosis  I65.23 EKG 12-Lead  2. Branch retinal artery occlusion of right eye 2013  H34.231   3. Hypercholesteremia  E78.00   4. Nonrheumatic aortic valve stenosis  I35.0 PCV ECHOCARDIOGRAM COMPLETE    EKG 01/01/2019: Sinus bradycardia at the rate of 59 bpm, normal axis, no evidence of ischemia, normal EKG.   EKG 0/08/2017: Sinus bradycardia at rate of 57 bpm, normal axis, anteroseptal infarct old. No evidence of ischemia.  Recommendations:  No orders of the defined types were placed in this encounter.   Patient is here on a six-month office visit and follow-up of asymptomatic bilateral carotid artery stenosis, trace aortic stenosis, hyperlipidemia. H/O central retinal artery occlusion in 2013.   On physical exam, no change in carotid pulse, however his aortic stenotic murmur appears to be much more prominent.  I'll repeat echocardiogram.  We'll continue to do carotid artery surveillance, although he is 85 years of age, continues to remain active and still working literally full-time.  He is now tolerating Crestor.  Will obtain previously performed labs from PCP.  Goal LDL <21m%. I'll see him back in 6 months for follow-up.  It is amazing and also awesome to see him.  JAdrian Prows MD, FZeiter Eye Surgical Center Inc11/13/2020, 1:48 PM PHicksvilleCardiovascular. PFairfax StationPager: [phone] Office: [phone] no answer Cell [phone]

## 2019-01-06 ENCOUNTER — Ambulatory Visit: Payer: PPO | Admitting: Diagnostic Neuroimaging

## 2019-01-06 ENCOUNTER — Encounter: Payer: Self-pay | Admitting: Diagnostic Neuroimaging

## 2019-01-06 ENCOUNTER — Other Ambulatory Visit: Payer: Self-pay

## 2019-01-06 VITALS — BP 112/76 | HR 64 | Temp 97.3°F | Ht 66.5 in | Wt 168.0 lb

## 2019-01-06 DIAGNOSIS — R413 Other amnesia: Secondary | ICD-10-CM

## 2019-01-06 NOTE — Progress Notes (Signed)
[facility]  PATIENT: Craig [name] DOB: 1931-05-26  REFERRING CLINICIAN: Sharlett Iles HISTORY FROM: patient and son-in-law REASON FOR VISIT: new consult    HISTORICAL  CHIEF COMPLAINT:  Chief Complaint  Patient presents with  . Memory Loss    rm 6, New Pt, son-in-law- [place] MMSE 15    HISTORY OF PRESENT ILLNESS:   85 year old male here for evaluation of memory loss.  Patient denies any memory problems.  Patient is here with son-in-law.  Patient canceled his appointments 3 times leading up to this evaluation today.  Apparently patient's wife has had some concerns about memory loss.  She is not here for this visit today.  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  He is somewhat argumentative and agitated today.  He feels that his PCP and wife do not know what they are talking about related to this memory problem.  He feels that he is fine and able to do all of his day-to-day routines.  Son-in-law also is not that concerned about memory loss issues.   REVIEW OF SYSTEMS: Full 14 system review of systems performed and negative with exception of: As per HPI.  ALLERGIES: Allergies  Allergen Reactions  . Vioxx [Rofecoxib] Rash    HOME MEDICATIONS: Outpatient Medications Prior to Visit  Medication Sig Dispense Refill  . allopurinol (ZYLOPRIM) 300 MG tablet Take 1 tablet by mouth.    . Apoaequorin 10 MG CAPS Take 10 mg by mouth daily.    Marland Kitchen aspirin 325 MG tablet Take 325 mg by mouth daily.    . rosuvastatin (CRESTOR) 10 MG tablet Take 10 mg by mouth daily.    . Misc. Devices MISC One touch verio test stripes #[id number]: E119     No facility-administered medications prior to visit.     PAST MEDICAL HISTORY: Past Medical History:  Diagnosis Date  . Allergy    vioxx  . CAD (coronary artery disease)   . Hyperlipidemia    pt states he never had high cholesterol  . Internal hemorrhoids    pt. states he does not have  hemorrhoids  . Knee osteoarthritis    pt. states he does not have osteo  . Memory difficulty     PAST SURGICAL HISTORY: Past Surgical History:  Procedure Laterality Date  . CARPAL TUNNEL RELEASE Right 2011  . CATARACT EXTRACTION Bilateral 2012  . mild hearing deficit    . TOTAL KNEE ARTHROPLASTY Left 2009  . TOTAL KNEE ARTHROPLASTY Right 2011  . WRIST FRACTURE SURGERY      FAMILY HISTORY: Family History  Problem Relation Age of Onset  . Colon cancer Neg Hx   . Stomach cancer Neg Hx     SOCIAL HISTORY: Social History   Socioeconomic History  . Marital status: Married    Spouse name: [name]  . Number of children: 4  . Years of education: college  . Highest education level: Not on file  Occupational History    Comment: sells jewelry  Social Needs  . Financial resource strain: Not on file  . Food insecurity    Worry: Not on file    Inability: Not on file  . Transportation needs    Medical: Not on file    Non-medical: Not on file  Tobacco Use  . Smoking status: Never Smoker  . Smokeless tobacco: Never Used  Substance and Sexual Activity  . Alcohol use: Yes    Alcohol/week: 14.0 standard drinks    Types:  14 Standard drinks or equivalent per week    Comment: 1 per day  . Drug use: No  . Sexual activity: Not on file  Lifestyle  . Physical activity    Days per week: Not on file    Minutes per session: Not on file  . Stress: Not on file  Relationships  . Social Herbalist on phone: Not on file    Gets together: Not on file    Attends religious service: Not on file    Active member of club or organization: Not on file    Attends meetings of clubs or organizations: Not on file    Relationship status: Not on file  . Intimate partner violence    Fear of current or ex partner: Not on file    Emotionally abused: Not on file    Physically abused: Not on file    Forced sexual activity: Not on file  Other Topics Concern  . Not on file  Social History  Narrative   01/06/19 Lives with wife   Caffeine- 2 daily     PHYSICAL EXAM  GENERAL EXAM/CONSTITUTIONAL: Vitals:  Vitals:   01/06/19 1027  BP: 112/76  Pulse: 64  Temp: (!) 97.3 F (36.3 C)  Weight: 168 lb (76.2 kg)  Height: 5' 6.5" (1.689 m)     Body mass index is 26.71 kg/m. Wt Readings from Last 3 Encounters:  01/06/19 168 lb (76.2 kg)  01/01/19 166 lb (75.3 kg)  06/20/14 172 lb (78 kg)     Patient is in no distress; well developed, nourished and groomed; neck is supple  CARDIOVASCULAR:  Examination of carotid arteries is normal; no carotid bruits  Regular rate and rhythm, no murmurs  Examination of peripheral vascular system by observation and palpation is normal  EYES:  Ophthalmoscopic exam of optic discs and posterior segments is normal; no papilledema or hemorrhages  No exam data present  MUSCULOSKELETAL:  Gait, strength, tone, movements noted in Neurologic exam below  NEUROLOGIC: MENTAL STATUS:  MMSE - [place] Exam 01/06/2019  Orientation to time 4  Orientation to Place 3  Registration 3  Attention/ Calculation 1  Recall 0  Language- name 2 objects 1  Language- repeat 0  Language- follow 3 step command 3  Language- read & follow direction 0  Write a sentence 0  Copy design 0  Total score 15    awake, alert, oriented to person  DECR memory intact  DECR  attention and concentration  language fluent, comprehension intact, naming intact  fund of knowledge appropriate  REPETITIVE; TANGENTIAL AT TIMES; CIRCUMLOCUTION; AGITATED  CRANIAL NERVE:   2nd - no papilledema on fundoscopic exam  2nd, 3rd, 4th, 6th - pupils equal and reactive to light, visual fields full to confrontation, extraocular muscles intact, no nystagmus  5th - facial sensation symmetric  7th - facial strength symmetric  8th - hearing intact  9th - palate elevates symmetrically, uvula midline  11th - shoulder shrug symmetric  12th - tongue  protrusion midline  MOTOR:   normal bulk and tone, full strength in the BUE, BLE  SENSORY:   normal and symmetric to light touch, temperature, vibration  COORDINATION:   finger-nose-finger, fine finger movements normal  REFLEXES:   deep tendon reflexes present and symmetric  GAIT/STATION:   narrow based gait     DIAGNOSTIC DATA (LABS, IMAGING, TESTING) - I reviewed patient records, labs, notes, testing and imaging myself where available.  Lab Results  Component Value Date   WBC 5.7 01/19/2010   HGB 13.9 02/07/2012   HCT 41.0 02/07/2012   MCV 97.1 01/19/2010   PLT 112 (L) 01/19/2010      Component Value Date/Time   NA 143 02/07/2012 2101   K 3.8 02/07/2012 2101   CL 108 02/07/2012 2101   CO2 26 01/18/2010 0440   GLUCOSE 92 02/07/2012 2101   BUN 18 02/07/2012 2101   CREATININE 1.10 02/07/2012 2101   CALCIUM 8.6 01/18/2010 0440   GFRNONAA >60 01/18/2010 0440   GFRAA  01/18/2010 0440    >60        The eGFR has been calculated using the MDRD equation. This calculation has not been validated in all clinical situations. eGFR's persistently <60 mL/min signify possible Chronic Kidney Disease.   Lab Results  Component Value Date   CHOL 161 10/15/2006   HDL 62.4 10/15/2006   LDLCALC 86 10/15/2006   TRIG 63 10/15/2006   CHOLHDL 2.6 CALC 10/15/2006   No results found for: HGBA1C No results found for: VITAMINB12 No results found for: TSH   02/07/12 MRI brain  1.  Normal MRI appearance of the brain for age. 2.  No acute or focal abnormality to explain the patient's symptoms. 3.  Mild sinus disease as described.      ASSESSMENT AND PLAN  85 y.o. year old male here for evaluation of memory loss.  Patient denies any problems and has poor insight.  He is tangential and argumentative in evaluation today.  Unfortunately wife is not here for evaluation although patient does not want wife involved in his care.  He does not want this information given to PCP.   I explained that I have to return my consult note to referring physician.  MMSE 15 out of 30.   Dx: suspected moderate dementia with behavioral disturbance  1. Memory loss       PLAN:  - consider MRI brain (patient declines further evaluation or testing) - consider memantine '10mg'$  at bedtime; increase to twice a day after 1-2 weeks - safety / supervision issues reviewed - caution with driving and finances - I am available to discuss with patient and family by phone if needed  Return for return to PCP, pending if symptoms worsen or fail to improve.    Penni Bombard, MD 94/44/6190, 12:22 AM Certified in Neurology, Neurophysiology and Neuroimaging  [facility] [address] [place], [place] [address] [phone]

## 2019-02-16 DIAGNOSIS — E7849 Other hyperlipidemia: Secondary | ICD-10-CM | POA: Diagnosis not present

## 2019-02-16 DIAGNOSIS — Z125 Encounter for screening for malignant neoplasm of prostate: Secondary | ICD-10-CM | POA: Diagnosis not present

## 2019-02-16 DIAGNOSIS — M109 Gout, unspecified: Secondary | ICD-10-CM | POA: Diagnosis not present

## 2019-02-23 DIAGNOSIS — Z0000 Encounter for general adult medical examination without abnormal findings: Secondary | ICD-10-CM | POA: Diagnosis not present

## 2019-02-23 DIAGNOSIS — H9193 Unspecified hearing loss, bilateral: Secondary | ICD-10-CM | POA: Diagnosis not present

## 2019-02-23 DIAGNOSIS — M109 Gout, unspecified: Secondary | ICD-10-CM | POA: Diagnosis not present

## 2019-02-23 DIAGNOSIS — Z1331 Encounter for screening for depression: Secondary | ICD-10-CM | POA: Diagnosis not present

## 2019-02-23 DIAGNOSIS — Z1339 Encounter for screening examination for other mental health and behavioral disorders: Secondary | ICD-10-CM | POA: Diagnosis not present

## 2019-02-23 DIAGNOSIS — R413 Other amnesia: Secondary | ICD-10-CM | POA: Diagnosis not present

## 2019-02-23 DIAGNOSIS — I10 Essential (primary) hypertension: Secondary | ICD-10-CM | POA: Diagnosis not present

## 2019-04-26 DIAGNOSIS — S61011A Laceration without foreign body of right thumb without damage to nail, initial encounter: Secondary | ICD-10-CM | POA: Diagnosis not present

## 2019-04-28 DIAGNOSIS — H47011 Ischemic optic neuropathy, right eye: Secondary | ICD-10-CM | POA: Diagnosis not present

## 2019-04-28 DIAGNOSIS — Z961 Presence of intraocular lens: Secondary | ICD-10-CM | POA: Diagnosis not present

## 2019-06-07 ENCOUNTER — Ambulatory Visit: Payer: PPO

## 2019-06-07 ENCOUNTER — Other Ambulatory Visit: Payer: Self-pay

## 2019-06-07 DIAGNOSIS — I350 Nonrheumatic aortic (valve) stenosis: Secondary | ICD-10-CM | POA: Diagnosis not present

## 2019-06-07 DIAGNOSIS — I6523 Occlusion and stenosis of bilateral carotid arteries: Secondary | ICD-10-CM | POA: Diagnosis not present

## 2019-06-13 ENCOUNTER — Other Ambulatory Visit: Payer: Self-pay | Admitting: Cardiology

## 2019-06-13 DIAGNOSIS — I6523 Occlusion and stenosis of bilateral carotid arteries: Secondary | ICD-10-CM

## 2019-06-14 NOTE — Progress Notes (Signed)
Normal LV function with minor abnormality

## 2019-06-14 NOTE — Progress Notes (Signed)
Moderate bilateral ICA stenosis, will continue to monitor. Discuss on OV soon

## 2019-06-24 DIAGNOSIS — E785 Hyperlipidemia, unspecified: Secondary | ICD-10-CM | POA: Diagnosis not present

## 2019-06-24 DIAGNOSIS — R413 Other amnesia: Secondary | ICD-10-CM | POA: Diagnosis not present

## 2019-06-24 DIAGNOSIS — H5461 Unqualified visual loss, right eye, normal vision left eye: Secondary | ICD-10-CM | POA: Diagnosis not present

## 2019-06-24 DIAGNOSIS — I6523 Occlusion and stenosis of bilateral carotid arteries: Secondary | ICD-10-CM | POA: Diagnosis not present

## 2019-06-24 DIAGNOSIS — I10 Essential (primary) hypertension: Secondary | ICD-10-CM | POA: Diagnosis not present

## 2019-06-24 DIAGNOSIS — M109 Gout, unspecified: Secondary | ICD-10-CM | POA: Diagnosis not present

## 2019-06-24 DIAGNOSIS — H9193 Unspecified hearing loss, bilateral: Secondary | ICD-10-CM | POA: Diagnosis not present

## 2019-06-30 NOTE — Progress Notes (Signed)
Primary Physician/Referring:  Leanna Battles, MD  Patient ID: Craig [name], male    DOB: 11-Jul-1931, 86 y.o.   MRN: [id number]  Chief Complaint  Patient presents with  . Aortic Stenosis  . Carotid Stenosis  . Follow-up    6 month   HPI:    Craig [name]  is a 86 y.o.  Caucasian male with hypertension, hyperlipidemia, retinal artery branch occlusion in December 2013 found to have moderate bilateral carotid artery stenosis, managed medically, hyperlipidemia presents here for follow-up of carotid stenosis.    He is active for his age. He goes for short walks around the park and also does stretches. He has an occasional cigar and he socially drinks. Recently has pulled a muscle in his right buttock area while mowing the lawn and working in the yard. Otherwise no specific complaints today, no dyspnea, palpitations or chest pain.  Past Medical History:  Diagnosis Date  . Allergy    vioxx  . CAD (coronary artery disease)   . Hyperlipidemia    pt states he never had high cholesterol  . Internal hemorrhoids    pt. states he does not have hemorrhoids  . Knee osteoarthritis    pt. states he does not have osteo  . Memory difficulty    Past Surgical History:  Procedure Laterality Date  . CARPAL TUNNEL RELEASE Right 2011  . CATARACT EXTRACTION Bilateral 2012  . mild hearing deficit    . TOTAL KNEE ARTHROPLASTY Left 2009  . TOTAL KNEE ARTHROPLASTY Right 2011  . WRIST FRACTURE SURGERY     Family History  Problem Relation Age of Onset  . Colon cancer Neg Hx   . Stomach cancer Neg Hx    Social History   Tobacco Use  . Smoking status: Never Smoker  . Smokeless tobacco: Never Used  Substance Use Topics  . Alcohol use: Yes    Alcohol/week: 14.0 standard drinks    Types: 14 Standard drinks or equivalent per week    Comment: 1 per day   Marital Status: Married ROS  Review of Systems  Cardiovascular: Negative for dyspnea on exertion, leg swelling and syncope.   Musculoskeletal: Positive for joint pain and neck pain. Negative for joint swelling.  Gastrointestinal: Negative for melena.  Neurological: Negative for headaches and light-headedness.  Psychiatric/Behavioral: Negative for depression and substance abuse.  All other systems reviewed and are negative.  Objective   Vitals with BMI 07/01/2019 01/06/2019 01/01/2019  Height 5' 6.5" 5' 6.5" 5' 7.7"  Weight 168 lbs 168 lbs 166 lbs  BMI 26.71 [id number] 0000000  Systolic 0000000 [id number]  Diastolic 67 76 76  Pulse 78 64 63      Physical Exam  Constitutional:  He is well-built and well-nourished in no acute distress, appears younger than stated age.  Cardiovascular: Normal rate and regular rhythm. Exam reveals no gallop.  Murmur heard.  Harsh midsystolic murmur is present with a grade of 3/6 at the upper right sternal border radiating to the neck. Pulses:      Carotid pulses are on the left side with bruit.      Femoral pulses are 2+ on the right side and 2+ on the left side.      Popliteal pulses are 1+ on the right side and 2+ on the left side.       Dorsalis pedis pulses are 1+ on the right side and 2+ on the left side.       Posterior tibial pulses are  0 on the right side and 2+ on the left side.  No leg edema, no JVD.  Pulmonary/Chest: Effort normal and breath sounds normal.  Abdominal: Soft. Bowel sounds are normal.    Laboratory examination:   No results for input(s): NA, K, CL, CO2, GLUCOSE, BUN, CREATININE, CALCIUM, GFRNONAA, GFRAA in the last 8760 hours. CrCl cannot be calculated (Patient's most recent lab result is older than the maximum 21 days allowed.).  CMP Latest Ref Rng & Units 02/07/2012 01/18/2010 01/17/2010  Glucose 70 - 99 mg/dL 92 119(H) 149(H)  BUN 6 - 23 mg/dL 18 10 9   Creatinine 0.50 - 1.35 mg/dL 1.10 0.97 0.89  Sodium 135 - 145 mEq/L 143 141 140  Potassium 3.5 - 5.1 mEq/L 3.8 3.8 4.0  Chloride 96 - 112 mEq/L 108 108 107  CO2 19 - 32 mEq/L - 26 26  Calcium 8.4 - 10.5  mg/dL - 8.6 8.6   CBC Latest Ref Rng & Units 02/07/2012 01/19/2010 01/18/2010  WBC 4.0 - 10.5 K/uL - 5.7 6.1  Hemoglobin 13.0 - 17.0 g/dL 13.9 9.2(L) 10.0(L)  Hematocrit 39.0 - 52.0 % 41.0 26.7(L) 29.2(L)  Platelets 150 - 400 K/uL - 112(L) 94 DELTA CHECK NOTED RESULT REPEATED AND VERIFIED SPECIMEN CHECKED FOR CLOTS(L)   Lipid Panel     Component Value Date/Time   CHOL 161 10/15/2006 0814   TRIG 63 10/15/2006 0814   HDL 62.4 10/15/2006 0814   CHOLHDL 2.6 CALC 10/15/2006 0814   VLDL 13 10/15/2006 0814   LDLCALC 86 10/15/2006 0814   HEMOGLOBIN A1C No results found for: HGBA1C, MPG TSH No results for input(s): TSH in the last 8760 hours.    External Labs:  Lipid Panel completed 02/16/2019 Cholesterol, total 163.000 m 02/16/2019 Triglycerides 76.000 02/16/2019 HDL 65 MG/DL 02/16/2019 LDL 83.000 mg 02/16/2019 NHDL 98  PSA 1.242 ng/ 02/16/2019  Glucose Random 101.000 m 02/16/2019  BUN 15.000 mg 02/16/2019 Creatinine, Serum 0.800 mg/ 02/16/2019  12/28/2012: TSH 2.02 normal PSA 0.687  Medications and allergies   Allergies  Allergen Reactions  . Vioxx [Rofecoxib] Rash     Current Outpatient Medications  Medication Instructions  . allopurinol (ZYLOPRIM) 300 MG tablet 1 tablet, Oral  . aspirin 325 mg, Oral, Daily  . memantine (NAMENDA) 5 mg, Oral, Daily at bedtime  . Misc. Devices MISC One touch verio test stripes #[id number]: E119  . rosuvastatin (CRESTOR) 10 mg, Oral, Daily    Radiology:  No results found.    Cardiac Studies:   Nuclear stress test 09/29/2017: 1. The patient performed treadmill exercise using Bruce protocol, completing 4:58 minutes. The patient completed an estimated workload of 7.0 METS, reaching 102% of the maximum predicted heart rate. Normal hemodynamic response. No stress symptoms reported. Low exercise capacity. Stress electrocardiogram shows 1.5 mm horizontal ST depressions in leads V5,V6, II and upsloping 1-1.5 mm ST depressions in leads III,  aVF that normalize within 1 min into recovery. Stress electrocardiogram is equivocal for ischemia. 2. The overall quality of the study is good. There is no evidence of abnormal lung activity. Stress and rest SPECT images demonstrate homogeneous tracer distribution throughout the myocardium. Gated SPECT imaging reveals normal myocardial thickening and wall motion. The left ventricular ejection fraction was normal (72%). 3. Low risk study.  Carotid artery duplex 06/07/2019:  Stenosis in the right internal carotid artery (16-49%). Mild stenosis in the right external carotid artery (<50%).  Stenosis in the left internal carotid artery (50-69%). Stenosis in the left external carotid artery (<50%).  Antegrade  right vertebral artery flow. Antegrade left vertebral artery flow.  Follow up in six months is appropriate if clinically indicated. Compared to 12/11/2018, no significant change, right ICA stenosis improved from >50%.  Echocardiogram 06/07/2019:  Normal LV systolic function with visual EF 60-65%. Left ventricle cavity is normal in size. Normal global wall motion. Normal diastolic filling pattern, normal LAP. Calculated EF 57%.  Left atrial cavity is mildly dilated.  Mild calcification of the aortic valve annulus. Mild aortic valve leaflet thickening with mild calcification. No evidence of aortic valve stenosis.  Mild (Grade I) mitral regurgitation.  Mild pulmonic regurgitation.  No significant change compared to prior study 11/05/2017.  EKG   EKG 07/01/2019: Normal sinus rhythm with rate of 73 bpm, normal axis, poor R wave progression, probably normal variant but cannot exclude anteroseptal infarct old. No evidence of ischemia. No significant change from 01/01/2019.   Assessment     ICD-10-CM   1. Bilateral carotid artery stenosis  I65.23 EKG 12-Lead  2. Hypercholesteremia  E78.00   3. Aortic valve sclerosis  I35.8   4. Peripheral artery disease (HCC)  I73.9     No orders of the defined  types were placed in this encounter.  Medications Discontinued During This Encounter  Medication Reason  . Apoaequorin 10 MG CAPS Patient has not taken in last 30 days    Recommendations:   Patient is here on a six-month office visit and follow-up of asymptomatic bilateral carotid artery stenosis, trace aortic stenosis, hyperlipidemia. H/O central retinal artery occlusion in 2013.   I reviewed the results of the echocardiogram, no significant change in aortic valve sclerosis and pressure gradients across the aortic valve. With regard to carotid stenosis, he still has moderate stenosis on the left and mild stenosis on the right, we'll continue to do carotid artery surveillance, although he is 86 years of age, continues to remain active and still working literally full-time.  He does have mild peripheral arterial disease but asymptomatic involving his right lower extremity.  He is now tolerating Crestor. His labs reviewed, goal LDL 70mg %, however non-HDL cholesterol is at goal. If I see progression of carotid disease and I will consider addition of Zetia. It is amazing and also awesome to see him.   Adrian Prows, MD, [facility] 07/01/2019, 10:07 AM [facility] Pager: [phone] Office: [phone]

## 2019-07-01 ENCOUNTER — Encounter: Payer: Self-pay | Admitting: Cardiology

## 2019-07-01 ENCOUNTER — Other Ambulatory Visit: Payer: Self-pay

## 2019-07-01 ENCOUNTER — Ambulatory Visit: Payer: PPO | Admitting: Cardiology

## 2019-07-01 VITALS — BP 125/67 | HR 78 | Temp 97.8°F | Resp 16 | Ht 66.5 in | Wt 168.0 lb

## 2019-07-01 DIAGNOSIS — E7800 Pure hypercholesterolemia, unspecified: Secondary | ICD-10-CM | POA: Diagnosis not present

## 2019-07-01 DIAGNOSIS — I6523 Occlusion and stenosis of bilateral carotid arteries: Secondary | ICD-10-CM

## 2019-07-01 DIAGNOSIS — I739 Peripheral vascular disease, unspecified: Secondary | ICD-10-CM | POA: Diagnosis not present

## 2019-07-01 DIAGNOSIS — I358 Other nonrheumatic aortic valve disorders: Secondary | ICD-10-CM

## 2019-07-02 DIAGNOSIS — S60413D Abrasion of left middle finger, subsequent encounter: Secondary | ICD-10-CM | POA: Diagnosis not present

## 2019-10-06 DIAGNOSIS — D1801 Hemangioma of skin and subcutaneous tissue: Secondary | ICD-10-CM | POA: Diagnosis not present

## 2019-10-06 DIAGNOSIS — D485 Neoplasm of uncertain behavior of skin: Secondary | ICD-10-CM | POA: Diagnosis not present

## 2019-10-06 DIAGNOSIS — L821 Other seborrheic keratosis: Secondary | ICD-10-CM | POA: Diagnosis not present

## 2019-10-06 DIAGNOSIS — Z85828 Personal history of other malignant neoplasm of skin: Secondary | ICD-10-CM | POA: Diagnosis not present

## 2019-10-06 DIAGNOSIS — L570 Actinic keratosis: Secondary | ICD-10-CM | POA: Diagnosis not present

## 2019-10-06 DIAGNOSIS — L814 Other melanin hyperpigmentation: Secondary | ICD-10-CM | POA: Diagnosis not present

## 2019-10-06 DIAGNOSIS — D0439 Carcinoma in situ of skin of other parts of face: Secondary | ICD-10-CM | POA: Diagnosis not present

## 2019-11-01 DIAGNOSIS — I10 Essential (primary) hypertension: Secondary | ICD-10-CM | POA: Diagnosis not present

## 2019-11-01 DIAGNOSIS — M109 Gout, unspecified: Secondary | ICD-10-CM | POA: Diagnosis not present

## 2019-11-01 DIAGNOSIS — M25511 Pain in right shoulder: Secondary | ICD-10-CM | POA: Diagnosis not present

## 2019-11-01 DIAGNOSIS — R413 Other amnesia: Secondary | ICD-10-CM | POA: Diagnosis not present

## 2019-11-01 DIAGNOSIS — E785 Hyperlipidemia, unspecified: Secondary | ICD-10-CM | POA: Diagnosis not present

## 2019-11-01 DIAGNOSIS — H5461 Unqualified visual loss, right eye, normal vision left eye: Secondary | ICD-10-CM | POA: Diagnosis not present

## 2019-11-30 ENCOUNTER — Ambulatory Visit: Payer: PPO

## 2019-11-30 ENCOUNTER — Other Ambulatory Visit: Payer: Self-pay

## 2019-11-30 DIAGNOSIS — I6523 Occlusion and stenosis of bilateral carotid arteries: Secondary | ICD-10-CM | POA: Diagnosis not present

## 2019-12-01 DIAGNOSIS — Z23 Encounter for immunization: Secondary | ICD-10-CM | POA: Diagnosis not present

## 2019-12-03 ENCOUNTER — Other Ambulatory Visit: Payer: PPO

## 2019-12-05 ENCOUNTER — Other Ambulatory Visit: Payer: Self-pay | Admitting: Cardiology

## 2019-12-05 DIAGNOSIS — I6523 Occlusion and stenosis of bilateral carotid arteries: Secondary | ICD-10-CM

## 2020-02-22 DIAGNOSIS — M109 Gout, unspecified: Secondary | ICD-10-CM | POA: Diagnosis not present

## 2020-02-22 DIAGNOSIS — Z125 Encounter for screening for malignant neoplasm of prostate: Secondary | ICD-10-CM | POA: Diagnosis not present

## 2020-02-22 DIAGNOSIS — E785 Hyperlipidemia, unspecified: Secondary | ICD-10-CM | POA: Diagnosis not present

## 2020-02-29 DIAGNOSIS — E785 Hyperlipidemia, unspecified: Secondary | ICD-10-CM | POA: Diagnosis not present

## 2020-02-29 DIAGNOSIS — I6523 Occlusion and stenosis of bilateral carotid arteries: Secondary | ICD-10-CM | POA: Diagnosis not present

## 2020-02-29 DIAGNOSIS — H9193 Unspecified hearing loss, bilateral: Secondary | ICD-10-CM | POA: Diagnosis not present

## 2020-02-29 DIAGNOSIS — M25511 Pain in right shoulder: Secondary | ICD-10-CM | POA: Diagnosis not present

## 2020-02-29 DIAGNOSIS — H5461 Unqualified visual loss, right eye, normal vision left eye: Secondary | ICD-10-CM | POA: Diagnosis not present

## 2020-02-29 DIAGNOSIS — R413 Other amnesia: Secondary | ICD-10-CM | POA: Diagnosis not present

## 2020-02-29 DIAGNOSIS — M542 Cervicalgia: Secondary | ICD-10-CM | POA: Diagnosis not present

## 2020-02-29 DIAGNOSIS — I10 Essential (primary) hypertension: Secondary | ICD-10-CM | POA: Diagnosis not present

## 2020-02-29 DIAGNOSIS — Z0000 Encounter for general adult medical examination without abnormal findings: Secondary | ICD-10-CM | POA: Diagnosis not present

## 2020-04-10 DIAGNOSIS — D485 Neoplasm of uncertain behavior of skin: Secondary | ICD-10-CM | POA: Diagnosis not present

## 2020-04-10 DIAGNOSIS — D1801 Hemangioma of skin and subcutaneous tissue: Secondary | ICD-10-CM | POA: Diagnosis not present

## 2020-04-10 DIAGNOSIS — L570 Actinic keratosis: Secondary | ICD-10-CM | POA: Diagnosis not present

## 2020-04-10 DIAGNOSIS — Z85828 Personal history of other malignant neoplasm of skin: Secondary | ICD-10-CM | POA: Diagnosis not present

## 2020-04-10 DIAGNOSIS — D0439 Carcinoma in situ of skin of other parts of face: Secondary | ICD-10-CM | POA: Diagnosis not present

## 2020-04-10 DIAGNOSIS — L821 Other seborrheic keratosis: Secondary | ICD-10-CM | POA: Diagnosis not present

## 2020-06-23 ENCOUNTER — Ambulatory Visit: Payer: PPO

## 2020-06-23 ENCOUNTER — Other Ambulatory Visit: Payer: Self-pay

## 2020-06-23 DIAGNOSIS — I6523 Occlusion and stenosis of bilateral carotid arteries: Secondary | ICD-10-CM | POA: Diagnosis not present

## 2020-06-25 NOTE — Progress Notes (Signed)
Moderate bilateral carotid disease, will discuss on OV soon

## 2020-06-29 DIAGNOSIS — L03012 Cellulitis of left finger: Secondary | ICD-10-CM | POA: Diagnosis not present

## 2020-06-29 DIAGNOSIS — E785 Hyperlipidemia, unspecified: Secondary | ICD-10-CM | POA: Diagnosis not present

## 2020-06-29 DIAGNOSIS — M542 Cervicalgia: Secondary | ICD-10-CM | POA: Diagnosis not present

## 2020-06-29 DIAGNOSIS — R413 Other amnesia: Secondary | ICD-10-CM | POA: Diagnosis not present

## 2020-06-29 DIAGNOSIS — M109 Gout, unspecified: Secondary | ICD-10-CM | POA: Diagnosis not present

## 2020-06-29 DIAGNOSIS — I10 Essential (primary) hypertension: Secondary | ICD-10-CM | POA: Diagnosis not present

## 2020-06-30 ENCOUNTER — Encounter: Payer: Self-pay | Admitting: Cardiology

## 2020-06-30 ENCOUNTER — Other Ambulatory Visit: Payer: Self-pay

## 2020-06-30 ENCOUNTER — Ambulatory Visit: Payer: PPO | Admitting: Cardiology

## 2020-06-30 VITALS — BP 136/71 | HR 71 | Temp 97.9°F | Resp 16 | Ht 66.5 in | Wt 169.2 lb

## 2020-06-30 DIAGNOSIS — I6523 Occlusion and stenosis of bilateral carotid arteries: Secondary | ICD-10-CM | POA: Diagnosis not present

## 2020-06-30 DIAGNOSIS — E7800 Pure hypercholesterolemia, unspecified: Secondary | ICD-10-CM | POA: Diagnosis not present

## 2020-06-30 DIAGNOSIS — I739 Peripheral vascular disease, unspecified: Secondary | ICD-10-CM | POA: Diagnosis not present

## 2020-06-30 NOTE — Progress Notes (Signed)
Primary Physician/Referring:  Leanna Battles, MD  Patient ID: Craig [name], male    DOB: 02-03-1932, 87 y.o.   MRN: [id number]  No chief complaint on file.  HPI:    Craig [name]  is a 87 y.o.  Caucasian male with hypertension, hyperlipidemia, retinal artery branch occlusion in December 2013 found to have moderate bilateral carotid artery stenosis, managed medically, hyperlipidemia presents here for 1 year follow-up of carotid stenosis.    He is active for his age.  He has not had any new neurologic deficits or visual disturbances.  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  He goes for short walks around the park and also does stretches. He has an occasional cigar and he socially drinks. There are no specific complaints today, no dyspnea, palpitations or chest pain.  Past Medical History:  Diagnosis Date  . Allergy    vioxx  . CAD (coronary artery disease)   . Hyperlipidemia    pt states he never had high cholesterol  . Internal hemorrhoids    pt. states he does not have hemorrhoids  . Knee osteoarthritis    pt. states he does not have osteo  . Memory difficulty    Past Surgical History:  Procedure Laterality Date  . CARPAL TUNNEL RELEASE Right 2011  . CATARACT EXTRACTION Bilateral 2012  . mild hearing deficit    . TOTAL KNEE ARTHROPLASTY Left 2009  . TOTAL KNEE ARTHROPLASTY Right 2011  . WRIST FRACTURE SURGERY     Family History  Problem Relation Age of Onset  . Colon cancer Neg Hx   . Stomach cancer Neg Hx    Social History   Tobacco Use  . Smoking status: Never Smoker  . Smokeless tobacco: Never Used  Substance Use Topics  . Alcohol use: Yes    Alcohol/week: 14.0 standard drinks    Types: 14 Standard drinks or equivalent per week    Comment: 1 per day   Marital Status: Married ROS  Review of Systems  Cardiovascular: Negative for dyspnea on exertion, leg swelling and syncope.  Musculoskeletal: Positive for joint pain and neck pain.  Negative for joint swelling.  Gastrointestinal: Negative for melena.  Neurological: Negative for headaches and light-headedness.  Psychiatric/Behavioral: Negative for depression and substance abuse.  All other systems reviewed and are negative.  Objective   Vitals with BMI 07/01/2019 01/06/2019 01/01/2019  Height 5' 6.5" 5' 6.5" 5' 7.7"  Weight 168 lbs 168 lbs 166 lbs  BMI 26.71 01.60 10.93  Systolic 235 573 220  Diastolic 67 76 76  Pulse 78 64 63      Physical Exam Constitutional:      Comments: He is well-built and well-nourished in no acute distress, appears younger than stated age.  Cardiovascular:     Rate and Rhythm: Normal rate and regular rhythm.     Pulses:          Carotid pulses are on the left side with bruit.      Femoral pulses are 2+ on the right side and 2+ on the left side.      Popliteal pulses are 1+ on the right side and 2+ on the left side.       Dorsalis pedis pulses are 1+ on the right side and 2+ on the left side.       Posterior tibial pulses are 0 on the right side and 2+ on the left side.     Heart sounds: Murmur heard.  Harsh midsystolic murmur is present with a grade of 3/6 at the upper right sternal border radiating to the neck. No gallop.      Comments: No leg edema, no JVD. Pulmonary:     Effort: Pulmonary effort is normal.     Breath sounds: Normal breath sounds.  Abdominal:     General: Bowel sounds are normal.     Palpations: Abdomen is soft.     Laboratory examination:   No results for input(s): NA, K, CL, CO2, GLUCOSE, BUN, CREATININE, CALCIUM, GFRNONAA, GFRAA in the last 8760 hours. CrCl cannot be calculated (Patient's most recent lab result is older than the maximum 21 days allowed.).  CMP Latest Ref Rng & Units 02/07/2012 01/18/2010 01/17/2010  Glucose 70 - 99 mg/dL 92 119(H) 149(H)  BUN 6 - 23 mg/dL $Remove'18 10 9  'OeMXkeZ$ Creatinine 0.50 - 1.35 mg/dL 1.10 0.97 0.89  Sodium 135 - 145 mEq/L 143 141 140  Potassium 3.5 - 5.1 mEq/L 3.8 3.8 4.0   Chloride 96 - 112 mEq/L 108 108 107  CO2 19 - 32 mEq/L - 26 26  Calcium 8.4 - 10.5 mg/dL - 8.6 8.6   CBC Latest Ref Rng & Units 02/07/2012 01/19/2010 01/18/2010  WBC 4.0 - 10.5 K/uL - 5.7 6.1  Hemoglobin 13.0 - 17.0 g/dL 13.9 9.2(L) 10.0(L)  Hematocrit 39.0 - 52.0 % 41.0 26.7(L) 29.2(L)  Platelets 150 - 400 K/uL - 112(L) 94 DELTA CHECK NOTED RESULT REPEATED AND VERIFIED SPECIMEN CHECKED FOR CLOTS(L)   Lipid Panel     Component Value Date/Time   CHOL 161 10/15/2006 0814   TRIG 63 10/15/2006 0814   HDL 62.4 10/15/2006 0814   CHOLHDL 2.6 CALC 10/15/2006 0814   VLDL 13 10/15/2006 0814   LDLCALC 86 10/15/2006 0814   HEMOGLOBIN A1C No results found for: HGBA1C, MPG TSH No results for input(s): TSH in the last 8760 hours.    External Labs:  Labs 02/22/2020:  Serum glucose 90 mg, BUN 17, creatinine 0.9, EGFR 80 mL, potassium 4.6, CMP normal.  Hb 14.8/HCT 45.6, platelets 249.  Normal indicis.  Total cholesterol 167, triglycerides 90, HDL 62, LDL 87, non-HDL cholesterol 105.  APO B 89, upper normal.  Lipid Panel completed 02/16/2019 Cholesterol, total 163.000 m 02/16/2019 Triglycerides 76.000 02/16/2019 HDL 65 MG/DL 02/16/2019 LDL 83.000 mg 02/16/2019 NHDL 98  PSA 1.242 ng/ 02/16/2019  Glucose Random 101.000 m 02/16/2019  BUN 15.000 mg 02/16/2019 Creatinine, Serum 0.800 mg/ 02/16/2019  12/28/2012: TSH 2.02 normal PSA 0.687  Medications and allergies   Allergies  Allergen Reactions  . Vioxx [Rofecoxib] Rash     Current Outpatient Medications  Medication Instructions  . allopurinol (ZYLOPRIM) 300 MG tablet 1 tablet, Oral  . aspirin 325 mg, Oral, Daily  . memantine (NAMENDA) 5 mg, Oral, Daily at bedtime  . Misc. Devices MISC One touch verio test stripes #[id number]: E119  . rosuvastatin (CRESTOR) 10 mg, Oral, Daily    Radiology:  No results found.    Cardiac Studies:   Nuclear stress test 09/29/2017: 1. The patient performed treadmill exercise using Bruce  protocol, completing 4:58 minutes. The patient completed an estimated workload of 7.0 METS, reaching 102% of the maximum predicted heart rate. Normal hemodynamic response. No stress symptoms reported. Low exercise capacity. Stress electrocardiogram shows 1.5 mm horizontal ST depressions in leads V5,V6, II and upsloping 1-1.5 mm ST depressions in leads III, aVF that normalize within 1 min into recovery. Stress electrocardiogram is equivocal for ischemia. 2. The overall quality of the  study is good. There is no evidence of abnormal lung activity. Stress and rest SPECT images demonstrate homogeneous tracer distribution throughout the myocardium. Gated SPECT imaging reveals normal myocardial thickening and wall motion. The left ventricular ejection fraction was normal (72%). 3. Low risk study.  Echocardiogram 06/07/2019:  Normal LV systolic function with visual EF 60-65%. Left ventricle cavity is normal in size. Normal global wall motion. Normal diastolic filling pattern, normal LAP. Calculated EF 57%.  Left atrial cavity is mildly dilated.  Mild calcification of the aortic valve annulus. Mild aortic valve leaflet thickening with mild calcification. No evidence of aortic valve stenosis.  Mild (Grade I) mitral regurgitation.  Mild pulmonic regurgitation.  No significant change compared to prior study 11/05/2017.  Carotid artery duplex 06/23/2020: Stenosis in the right internal carotid artery (50-69%). Stenosis in the right external carotid artery (>50%). Stenosis in the left internal carotid artery (50-69%). Stenosis in the left external carotid artery (<50%). Antegrade right vertebral artery flow. Antegrade left vertebral artery flow. No significant change from  11/30/2019. Follow up in six months is appropriate if clinically indicated.  EKG    EKG 06/30/2020: Normal sinus rhythm at rate of 75 bpm, normal axis, poor R wave progression, cannot exclude anteroseptal infarct 4.  No evidence of ischemia,  normal QT interval.  Probably normal EKG.No significant change from  EKG 07/01/2019   Assessment     ICD-10-CM   1. Bilateral carotid artery stenosis  I65.23   2. Hypercholesteremia  E78.00   3. Peripheral artery disease (HCC)  I73.9     No orders of the defined types were placed in this encounter.  There are no discontinued medications.  Recommendations:   Craig [name] is a 87 y.o. Caucasian male with hypertension, hyperlipidemia, retinal artery branch occlusion in December 2013 found to have moderate bilateral carotid artery stenosis, managed medically, hyperlipidemia presents here for 1 year follow-up of carotid stenosis.    With regard to carotid stenosis, there is mild progression noted previously 6 months ago, this is stabilized. We'll continue to do carotid artery surveillance, although he is 87 years of age, continues to remain active and still working literally full-time.  He does have mild peripheral arterial disease by vascular exam, but asymptomatic involving his right lower extremity.  No change in vascular exam from previous.  He is now tolerating Crestor. His labs reviewed, goal LDL '70mg'$ %, however non-HDL cholesterol is at goal. If I see progression of carotid disease and I will consider addition of Zetia.  It was a pleasure to see him today.    Adrian Prows, MD, [facility] 06/30/2020, 6:59 AM Office: [phone] Pager: [phone]

## 2020-11-23 ENCOUNTER — Telehealth: Payer: Self-pay | Admitting: Cardiology

## 2020-11-23 DIAGNOSIS — I6523 Occlusion and stenosis of bilateral carotid arteries: Secondary | ICD-10-CM

## 2020-11-23 NOTE — Telephone Encounter (Signed)
Rescheduled patient's follow up appointment; patient's daughter said patient normally has a carotid duplex before a follow up, so she was wondering if it is necessary for the patient to have another one before his 11/3 follow up. If this is the case, there will need to be an order placed, then I can schedule this appointment.

## 2020-11-23 NOTE — Telephone Encounter (Signed)
ICD-10-CM   1. Bilateral carotid artery stenosis  I65.23 PCV CAROTID DUPLEX (BILATERAL)      Adrian Prows, MD, [facility] 11/23/2020, 4:21 PM Office: [phone] Fax: [phone] Pager: [phone]

## 2020-11-23 NOTE — Telephone Encounter (Signed)
Did pt need carotid before November appointment?

## 2020-11-24 NOTE — Telephone Encounter (Signed)
Order placed you can make appointment now. Thanks

## 2020-12-13 ENCOUNTER — Other Ambulatory Visit: Payer: Self-pay

## 2020-12-13 ENCOUNTER — Ambulatory Visit: Payer: PPO

## 2020-12-13 DIAGNOSIS — I6523 Occlusion and stenosis of bilateral carotid arteries: Secondary | ICD-10-CM | POA: Diagnosis not present

## 2020-12-14 NOTE — Progress Notes (Signed)
Mild progression of the disease in the left carotid to >70%, will recheck in 6 months and discuss on offic visit soon.

## 2020-12-20 DIAGNOSIS — L821 Other seborrheic keratosis: Secondary | ICD-10-CM | POA: Diagnosis not present

## 2020-12-20 DIAGNOSIS — Z85828 Personal history of other malignant neoplasm of skin: Secondary | ICD-10-CM | POA: Diagnosis not present

## 2020-12-20 DIAGNOSIS — D1801 Hemangioma of skin and subcutaneous tissue: Secondary | ICD-10-CM | POA: Diagnosis not present

## 2020-12-20 DIAGNOSIS — L570 Actinic keratosis: Secondary | ICD-10-CM | POA: Diagnosis not present

## 2020-12-20 DIAGNOSIS — D485 Neoplasm of uncertain behavior of skin: Secondary | ICD-10-CM | POA: Diagnosis not present

## 2020-12-20 DIAGNOSIS — C44629 Squamous cell carcinoma of skin of left upper limb, including shoulder: Secondary | ICD-10-CM | POA: Diagnosis not present

## 2020-12-20 DIAGNOSIS — L812 Freckles: Secondary | ICD-10-CM | POA: Diagnosis not present

## 2020-12-21 ENCOUNTER — Ambulatory Visit: Payer: PPO | Admitting: Cardiology

## 2020-12-21 ENCOUNTER — Encounter: Payer: Self-pay | Admitting: Cardiology

## 2020-12-21 ENCOUNTER — Other Ambulatory Visit: Payer: Self-pay

## 2020-12-21 VITALS — BP 130/76 | HR 68 | Temp 97.9°F | Resp 16 | Ht 66.5 in | Wt 168.0 lb

## 2020-12-21 DIAGNOSIS — I6523 Occlusion and stenosis of bilateral carotid arteries: Secondary | ICD-10-CM | POA: Diagnosis not present

## 2020-12-21 DIAGNOSIS — H34231 Retinal artery branch occlusion, right eye: Secondary | ICD-10-CM | POA: Diagnosis not present

## 2020-12-21 DIAGNOSIS — E7800 Pure hypercholesterolemia, unspecified: Secondary | ICD-10-CM | POA: Diagnosis not present

## 2020-12-21 NOTE — Progress Notes (Signed)
Primary Physician/Referring:  Leanna Battles, MD  Patient ID: Craig [name], male    DOB: 05-29-1931, 87 y.o.   MRN: [id number]  Chief Complaint  Patient presents with   Hyperlipidemia   Follow-up    6 months   Carotid Artery Stenosis    HPI:    Craig [name]  is a 87 y.o.  Caucasian male with hypertension, hyperlipidemia, retinal artery branch occlusion in December 2013 found to have moderate bilateral carotid artery stenosis, managed medically, hyperlipidemia presents here for 1 year follow-up of carotid stenosis.    He is active for his age.  He has not had any new neurologic deficits or visual disturbances.  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  He goes for short walks around the park and also does stretches. There are no specific complaints today, no dyspnea, palpitations or chest pain.  He is accompanied by his second daughter [name] who has questions regarding lipid therapy as she likes to follow holistic medicine.  Past Medical History:  Diagnosis Date   Allergy    vioxx   CAD (coronary artery disease)    Hyperlipidemia    pt states he never had high cholesterol   Internal hemorrhoids    pt. states he does not have hemorrhoids   Knee osteoarthritis    pt. states he does not have osteo   Memory difficulty    Past Surgical History:  Procedure Laterality Date   CARPAL TUNNEL RELEASE Right 2011   CATARACT EXTRACTION Bilateral 2012   mild hearing deficit     TOTAL KNEE ARTHROPLASTY Left 2009   TOTAL KNEE ARTHROPLASTY Right 2011   WRIST FRACTURE SURGERY     Family History  Problem Relation Age of Onset   Colon cancer Neg Hx    Stomach cancer Neg Hx    Social History   Tobacco Use   Smoking status: Never   Smokeless tobacco: Never  Substance Use Topics   Alcohol use: Yes    Alcohol/week: 16.0 standard drinks    Types: 14 Standard drinks or equivalent, 1 Glasses of wine, 1 Cans of beer per week    Comment: 1 per day   Marital Status:  Married ROS  Review of Systems  Cardiovascular:  Negative for chest pain, dyspnea on exertion and leg swelling.  Gastrointestinal:  Negative for melena.  Objective   Vitals with BMI 12/21/2020 06/30/2020 07/01/2019  Height 5' 6.5" 5' 6.5" 5' 6.5"  Weight 168 lbs 169 lbs 3 oz 168 lbs  BMI 26.71 77.8 24.23  Systolic 536 144 315  Diastolic 76 71 67  Pulse 68 71 78      Physical Exam Constitutional:      Comments: He is well-built and well-nourished in no acute distress, appears younger than stated age.  Neck:     Vascular: Carotid bruit (bilateral) present. No JVD.  Cardiovascular:     Rate and Rhythm: Normal rate and regular rhythm.     Pulses:          Carotid pulses are  on the left side with bruit.      Femoral pulses are 2+ on the right side and 2+ on the left side.      Popliteal pulses are 2+ on the right side and 2+ on the left side.       Dorsalis pedis pulses are 1+ on the right side and 1+ on the left side.       Posterior tibial pulses  are 1+ on the right side and 1+ on the left side.     Heart sounds: Murmur heard.  Harsh midsystolic murmur is present with a grade of 2/6 at the upper right sternal border radiating to the neck.    No gallop.  Pulmonary:     Effort: Pulmonary effort is normal.     Breath sounds: Normal breath sounds.  Abdominal:     General: Bowel sounds are normal.     Palpations: Abdomen is soft.  Musculoskeletal:     Right lower leg: No edema.     Left lower leg: No edema.   Laboratory examination:    External Labs:  Labs 02/22/2020:  Serum glucose 90 mg, BUN 17, creatinine 0.9, EGFR 80 mL, potassium 4.6, CMP normal.  Hb 14.8/HCT 45.6, platelets 249.  Normal indicis.  Total cholesterol 167, triglycerides 90, HDL 62, LDL 87, non-HDL cholesterol 105.  APO B 89, upper normal.  Lipid Panel completed 02/16/2019 Cholesterol, total 163.000 m 02/16/2019 Triglycerides 76.000 02/16/2019 HDL 65 MG/DL 02/16/2019 LDL 83.000 mg 02/16/2019 NHDL  98  PSA 1.242 ng/ 02/16/2019  Glucose Random 101.000 m 02/16/2019  BUN 15.000 mg 02/16/2019 Creatinine, Serum 0.800 mg/ 02/16/2019  Medications and allergies   Allergies  Allergen Reactions   Vioxx [Rofecoxib] Rash     Current Outpatient Medications  Medication Instructions   allopurinol (ZYLOPRIM) 300 MG tablet 1 tablet, Oral   Apoaequorin (PREVAGEN) 10 MG CAPS 1 capsule, Oral, Daily   aspirin 325 mg, Oral, Daily   memantine (NAMENDA) 5 mg, Oral, Daily at bedtime   Misc. Devices MISC One touch verio test stripes #[id number]: E119   rosuvastatin (CRESTOR) 10 mg, Oral, Daily    Radiology:  No results found.    Cardiac Studies:   Nuclear stress test  09/29/2017: 1. The patient performed treadmill exercise using Bruce protocol, completing 4:58 minutes. The patient completed an estimated workload of 7.0 METS, reaching 102% of the maximum predicted heart rate. Normal hemodynamic response. No stress symptoms reported. Low exercise capacity. Stress electrocardiogram shows 1.5 mm horizontal ST depressions in leads V5,V6, II and upsloping 1-1.5 mm ST depressions in leads III, aVF that normalize within 1 min into recovery. Stress electrocardiogram is equivocal for ischemia. 2. The overall quality of the study is good. There is no evidence of abnormal lung activity. Stress and rest SPECT images demonstrate homogeneous tracer distribution throughout the myocardium. Gated SPECT imaging reveals normal myocardial thickening and wall motion. The left ventricular ejection fraction was normal (72%). 3. Low risk study.   Echocardiogram 06/07/2019:  Normal LV systolic function with visual EF 60-65%. Left ventricle cavity is normal in size. Normal global wall motion. Normal diastolic filling pattern, normal LAP. Calculated EF 57%.  Left atrial cavity is mildly dilated.  Mild calcification of the aortic valve annulus. Mild aortic valve leaflet thickening with mild calcification. No evidence of aortic  valve stenosis.  Mild (Grade I) mitral regurgitation.  Mild pulmonic regurgitation.  No significant change compared to prior study 11/05/2017.  Carotid artery duplex 12/13/2020:  Stenosis in the right internal carotid artery (50-69%). Stenosis in the right external carotid artery (>50%). Stenosis in the left internal carotid artery (50-69%). The left PSV internal/common carotid artery ratio of 4.27 is consistent with a stenosis of >70%. Stenosis in the left external carotid artery (<50%). Antegrade right vertebral artery flow. Antegrade left vertebral artery flow. Compared to 06/23/2020, there is slight progression of the disease severity in the left ICA. Follow up in six months  is appropriate if clinically indicated.  EKG   EKG 12/21/2020: Normal sinus rhythm at rate of 67 bpm, normal axis, poor R wave progression, probably normal variant however cannot exclude anteroseptal infarct old.  No significant change from 06/30/2020.  Assessment     ICD-10-CM   1. Bilateral carotid artery stenosis  I65.23 EKG 12-Lead    PCV CAROTID DUPLEX (BILATERAL)    2. Hypercholesteremia  E78.00     3. Branch retinal artery occlusion of right eye 2013  H34.231       No orders of the defined types were placed in this encounter.  There are no discontinued medications.  Recommendations:   JETSON PICKREL is a 87 y.o. Caucasian male with hypertension, hyperlipidemia, retinal artery branch occlusion in December 2013 found to have moderate bilateral carotid artery stenosis, managed medically, hyperlipidemia presents here for 1 year follow-up of carotid stenosis.  He is accompanied by his daughter [name], who has multiple questions regarding statin therapy.  Patient would like to continue taking all the medications prescribed by me, she is more holistic.  I have discussed with him regarding presence of bilateral carotid artery disease and also mild progression in the left carotid that is noted.    He is  presently 87 years of age and I usually do not do surveillance duplex on ultra elderly patients however Mr. Thomas Mabry is very active, still is [place], otherwise he has no significant other medical issues except a remote branch retinal artery occlusion which is probably on the right eye.  Hence continued surveillance is indicated, carotid duplex orders placed.  And also encouraged patient's daughter that he should continue taking statins and in fact I would like to try to get the LDL to <70 with addition of Zetia if not at goal.  Blood pressure is well controlled, no clinical evidence of heart failure, no change in his physical exam.  I will see him back in 6 months for follow-up. It was a pleasure to see him today.    Adrian Prows, MD, [facility] 12/21/2020, 12:41 PM Office: [phone] Pager: [phone]

## 2021-01-01 ENCOUNTER — Ambulatory Visit: Payer: PPO | Admitting: Cardiology

## 2021-01-02 DIAGNOSIS — E785 Hyperlipidemia, unspecified: Secondary | ICD-10-CM | POA: Diagnosis not present

## 2021-01-02 DIAGNOSIS — I6523 Occlusion and stenosis of bilateral carotid arteries: Secondary | ICD-10-CM | POA: Diagnosis not present

## 2021-01-02 DIAGNOSIS — R413 Other amnesia: Secondary | ICD-10-CM | POA: Diagnosis not present

## 2021-01-02 DIAGNOSIS — H5461 Unqualified visual loss, right eye, normal vision left eye: Secondary | ICD-10-CM | POA: Diagnosis not present

## 2021-01-02 DIAGNOSIS — H9193 Unspecified hearing loss, bilateral: Secondary | ICD-10-CM | POA: Diagnosis not present

## 2021-01-02 DIAGNOSIS — I10 Essential (primary) hypertension: Secondary | ICD-10-CM | POA: Diagnosis not present

## 2021-02-05 DIAGNOSIS — H26491 Other secondary cataract, right eye: Secondary | ICD-10-CM | POA: Diagnosis not present

## 2021-02-05 DIAGNOSIS — Z961 Presence of intraocular lens: Secondary | ICD-10-CM | POA: Diagnosis not present

## 2021-02-05 DIAGNOSIS — H47011 Ischemic optic neuropathy, right eye: Secondary | ICD-10-CM | POA: Diagnosis not present

## 2021-02-05 DIAGNOSIS — H538 Other visual disturbances: Secondary | ICD-10-CM | POA: Diagnosis not present

## 2021-02-08 ENCOUNTER — Encounter (HOSPITAL_BASED_OUTPATIENT_CLINIC_OR_DEPARTMENT_OTHER): Payer: Self-pay

## 2021-02-08 ENCOUNTER — Emergency Department (HOSPITAL_BASED_OUTPATIENT_CLINIC_OR_DEPARTMENT_OTHER): Payer: PPO

## 2021-02-08 ENCOUNTER — Other Ambulatory Visit: Payer: Self-pay

## 2021-02-08 ENCOUNTER — Emergency Department (HOSPITAL_BASED_OUTPATIENT_CLINIC_OR_DEPARTMENT_OTHER)
Admission: EM | Admit: 2021-02-08 | Discharge: 2021-02-08 | Disposition: A | Discharge status: Home / Self Care | Payer: PPO | Attending: Emergency Medicine | Admitting: Emergency Medicine

## 2021-02-08 DIAGNOSIS — R110 Nausea: Secondary | ICD-10-CM | POA: Insufficient documentation

## 2021-02-08 DIAGNOSIS — I2510 Atherosclerotic heart disease of native coronary artery without angina pectoris: Secondary | ICD-10-CM | POA: Diagnosis not present

## 2021-02-08 DIAGNOSIS — Z96653 Presence of artificial knee joint, bilateral: Secondary | ICD-10-CM | POA: Insufficient documentation

## 2021-02-08 DIAGNOSIS — R55 Syncope and collapse: Secondary | ICD-10-CM | POA: Diagnosis not present

## 2021-02-08 DIAGNOSIS — Z79899 Other long term (current) drug therapy: Secondary | ICD-10-CM | POA: Diagnosis not present

## 2021-02-08 DIAGNOSIS — R42 Dizziness and giddiness: Secondary | ICD-10-CM | POA: Insufficient documentation

## 2021-02-08 DIAGNOSIS — R4020 Unspecified coma: Secondary | ICD-10-CM | POA: Diagnosis not present

## 2021-02-08 DIAGNOSIS — Z7982 Long term (current) use of aspirin: Secondary | ICD-10-CM | POA: Insufficient documentation

## 2021-02-08 DIAGNOSIS — I959 Hypotension, unspecified: Secondary | ICD-10-CM | POA: Diagnosis not present

## 2021-02-08 DIAGNOSIS — R569 Unspecified convulsions: Secondary | ICD-10-CM | POA: Diagnosis not present

## 2021-02-08 LAB — COMPREHENSIVE METABOLIC PANEL
ALT: 29 U/L (ref 0–44)
AST: 29 U/L (ref 15–41)
Albumin: 4.5 g/dL (ref 3.5–5.0)
Alkaline Phosphatase: 58 U/L (ref 38–126)
Anion gap: 8 (ref 5–15)
BUN: 19 mg/dL (ref 8–23)
CO2: 26 mmol/L (ref 22–32)
Calcium: 9.6 mg/dL (ref 8.9–10.3)
Chloride: 108 mmol/L (ref 98–111)
Creatinine, Ser: 0.86 mg/dL (ref 0.61–1.24)
GFR, Estimated: >60 mL/min (ref >60)
Glucose, Bld: 100 mg/dL — ABNORMAL HIGH (ref 70–99)
Potassium: 4 mmol/L (ref 3.5–5.1)
Sodium: 142 mmol/L (ref 135–145)
Total Bilirubin: 0.6 mg/dL (ref 0.3–1.2)
Total Protein: 6.7 g/dL (ref 6.5–8.1)

## 2021-02-08 LAB — CBC WITH DIFFERENTIAL/PLATELET
Abs Immature Granulocytes: 0.03 10*3/uL (ref 0.00–0.07)
Basophils Absolute: 0.1 10*3/uL (ref 0.0–0.1)
Basophils Relative: 1 %
Eosinophils Absolute: 0.1 10*3/uL (ref 0.0–0.5)
Eosinophils Relative: 1 %
HCT: 42.2 % (ref 39.0–52.0)
Hemoglobin: 13.5 g/dL (ref 13.0–17.0)
Immature Granulocytes: 0 %
Lymphocytes Relative: 16 %
Lymphs Abs: 1.3 10*3/uL (ref 0.7–4.0)
MCH: 31.5 pg (ref 26.0–34.0)
MCHC: 32 g/dL (ref 30.0–36.0)
MCV: 98.6 fL (ref 80.0–100.0)
Monocytes Absolute: 0.4 10*3/uL (ref 0.1–1.0)
Monocytes Relative: 5 %
Neutro Abs: 6.3 10*3/uL (ref 1.7–7.7)
Neutrophils Relative %: 77 %
Platelets: 153 10*3/uL (ref 150–400)
RBC: 4.28 MIL/uL (ref 4.22–5.81)
RDW: 12.8 % (ref 11.5–15.5)
WBC: 8.2 10*3/uL (ref 4.0–10.5)
nRBC: 0 % (ref 0.0–0.2)

## 2021-02-08 LAB — CBG MONITORING, ED: Glucose-Capillary: 82 mg/dL (ref 70–99)

## 2021-02-08 NOTE — ED Provider Notes (Signed)
[facility] DEPT Provider Note   CSN: [id number] Arrival date & time: 02/08/21  1126     History Chief Complaint  Patient presents with   Loss of Consciousness    Craig [name] is a 87 y.o. male.  HPI 87 year old male presents with syncope.  History is from son-in-law at the bedside and later the daughter over the phone.  Patient apparently abruptly complained of not feeling well and asking for a glass of water while he was seated.  He then complained of some nausea and when she went to go get something for him to throw up in she realized that he actually passed out.  He was unresponsive for 3-4 minutes by her estimation.  When they called 911 they told him to lower him to the ground and shortly after laying him flat on the ground a seem like he woke back up.  He seemed to quickly regain consciousness after that and be back to normal.  While he was unresponsive it looked like his left leg was the only thing that was moving but it was not rhythmic shaking like a seizure and she estimated it moved about 5 times.  He otherwise is acting at his normal baseline currently.  Past Medical History:  Diagnosis Date   Allergy    vioxx   CAD (coronary artery disease)    Hyperlipidemia    pt states he never had high cholesterol   Internal hemorrhoids    pt. states he does not have hemorrhoids   Knee osteoarthritis    pt. states he does not have osteo   Memory difficulty     There are no problems to display for this patient.   Past Surgical History:  Procedure Laterality Date   CARPAL TUNNEL RELEASE Right 2011   CATARACT EXTRACTION Bilateral 2012   mild hearing deficit     TOTAL KNEE ARTHROPLASTY Left 2009   TOTAL KNEE ARTHROPLASTY Right 2011   WRIST FRACTURE SURGERY         Family History  Problem Relation Age of Onset   Colon cancer Neg Hx    Stomach cancer Neg Hx     Social History   Tobacco Use   Smoking status: Never   Smokeless tobacco:  Never  Vaping Use   Vaping Use: Never used  Substance Use Topics   Alcohol use: Yes    Alcohol/week: 16.0 standard drinks    Types: 14 Standard drinks or equivalent, 1 Glasses of wine, 1 Cans of beer per week    Comment: 1 per day   Drug use: No    Home Medications Prior to Admission medications   Medication Sig Start Date End Date Taking? Authorizing Provider  allopurinol (ZYLOPRIM) 300 MG tablet Take 1 tablet by mouth. 12/30/17   [provider]  Apoaequorin (PREVAGEN) 10 MG CAPS Take 1 capsule by mouth daily. 02/19/18   [provider]  aspirin 325 MG tablet Take 325 mg by mouth daily.    [provider]  memantine (NAMENDA) 5 MG tablet Take 5 mg by mouth at bedtime. 02/23/19   [provider]  Misc. Devices MISC One touch verio test stripes #50Dx: E119 06/30/14   [provider]  rosuvastatin (CRESTOR) 10 MG tablet Take 10 mg by mouth daily.    [provider]    Allergies    Vioxx [rofecoxib]  Review of Systems   Review of Systems  Respiratory:  Negative for shortness of breath.  Cardiovascular:  Negative for chest pain.  Gastrointestinal:  Positive for nausea.  Neurological:  Positive for syncope and light-headedness.  All other systems reviewed and are negative.  Physical Exam Updated Vital Signs BP (!) 149/63 (BP Location: Right Arm)    Pulse 63    Temp 97.8 F (36.6 C)    Resp 16    Ht 5\' 8"  (1.727 m)    Wt 72.6 kg    SpO2 100%    BMI 24.33 kg/m   Physical Exam Vitals and nursing note reviewed.  Constitutional:      General: He is not in acute distress.    Appearance: He is well-developed. He is not ill-appearing or diaphoretic.  HENT:     Head: Normocephalic and atraumatic.     Right Ear: External ear normal.     Left Ear: External ear normal.     Nose: Nose normal.  Eyes:     General:        Right eye: No discharge.        Left eye: No discharge.     Extraocular Movements: Extraocular movements intact.      Pupils: Pupils are equal, round, and reactive to light.  Cardiovascular:     Rate and Rhythm: Normal rate and regular rhythm.     Heart sounds: Murmur heard.  Pulmonary:     Effort: Pulmonary effort is normal.     Breath sounds: Normal breath sounds.  Abdominal:     Palpations: Abdomen is soft.     Tenderness: There is no abdominal tenderness.  Musculoskeletal:     Cervical back: Neck supple.  Skin:    General: Skin is warm and dry.  Neurological:     Mental Status: He is alert.     Comments: CN 3-12 grossly intact. 5/5 strength in all 4 extremities. Grossly normal sensation. Normal finger to nose.   Psychiatric:        Mood and Affect: Mood is not anxious.    ED Results / Procedures / Treatments   Labs (all labs ordered are listed, but only abnormal results are displayed) Labs Reviewed  CBC WITH DIFFERENTIAL/PLATELET  COMPREHENSIVE METABOLIC PANEL  CBG MONITORING, ED    EKG EKG Interpretation  Date/Time:  Thursday February 08 2021 11:34:40 EST Ventricular Rate:  62 PR Interval:  188 QRS Duration: 78 QT Interval:  396 QTC Calculation: 401 R Axis:   30 Text Interpretation: Normal sinus rhythm Septal infarct , age undetermined Abnormal ECG no significant change since 2010 Confirmed by Sherwood Gambler [phone]) on 02/08/2021 11:51:11 AM  Radiology No results found.  Procedures Procedures   Medications Ordered in ED Medications - No data to display  ED Course  I have reviewed the triage vital signs and the nursing notes.  Pertinent labs & imaging results that were available during my care of the patient were reviewed by me and considered in my medical decision making (see chart for details).    MDM Rules/Calculators/A&P                         Patient with syncope of unclear etiology.  He denies any preceding symptoms besides the nausea and the lightheadedness.  He is alert and oriented here.  He definitely does not want to come into the hospital.  There was  a question of maybe his left leg was moving but I think this was unlikely to be seizure-like given it only moved a couple times  and was not rhythmic.  He was also completely unconscious during this and I would expect further obvious seizure activity.  Either way his work-up so far is unremarkable though labs are still pending.I discussed being observed in the hospital for further cardiac monitoring and cardiac work-up but he declines.  He seems understand what I am worried about, which is primarily a cardiac arrhythmia causing syncope.  At this point, metabolic panel still pending and care was transferred to Dr. Ashok Cordia.    Final Clinical Impression(s) / ED Diagnoses Final diagnoses:  None    Rx / DC Orders ED Discharge Orders     None        Sherwood Gambler, MD 02/08/21 1528

## 2021-02-08 NOTE — ED Triage Notes (Signed)
Pt BIB [facility]. Pt coming from home and had a syncopal episode. Pt's daughter lowered him to the ground. Pt did have some nausea prior. EMS report pt had no orthostatic changes on V/S. Pt is A/Ox4 on arrival.   EMS Vitals: 116/60 CBG 110 HR 60 SpO2 100% on R/A RR 16

## 2021-02-08 NOTE — ED Provider Notes (Signed)
Signed out by Dr Regenia Skeeter that pt w syncopal event earlier, ?vasovagal, and that pt does not want to stay in hospital, to check labs and if ok d/c to home then.   Labs unremarkable.   Pt denies any c/o currently. No faintness or dizziness. No chest pain or sob.   Pt currently appears stable for d/c.  Return precautions provided.      Lajean Saver, MD 02/08/21 [phone]

## 2021-02-08 NOTE — Discharge Instructions (Addendum)
It was our pleasure to provide your ER care today - we hope that you feel better.  Drink plenty of fluids/stay well hydrated.   Follow up with your doctor/cardiologist in the coming week - call office today or in AM tomorrow to arrange appointment.   Return to ER if worse, new symptoms, fevers, new or severe pain, abdominal pain, persistent vomiting, chest pain, trouble breathing, fainting, rapid heart beating or other concern.

## 2021-02-15 NOTE — Progress Notes (Signed)
Primary Physician/Referring:  [name], MD  Patient ID: Craig [name], male    DOB: 1931/07/14, 87 y.o.   MRN: [id number]  Chief Complaint  Patient presents with   Loss of Consciousness   Hospitalization Follow-up    HPI:    Craig [name]  is a 87 y.o.  Caucasian male with hypertension, hyperlipidemia, retinal artery branch occlusion in December 2013 found to have moderate bilateral carotid artery stenosis, managed medically, hyperlipidemia.   Patient was last seen in our office 11/3/2022For annual follow-up.  Last office visit patient was stable from a cardiovascular standpoint and Zetia was added for improved lipid management.  He now presents for urgent visit following Syncopal episode 02/08/2021.  Patient was evaluated in the emergency department at that time.  Chest x-ray, head CT, and CBC and CMP were within normal limits at the ED.  EKG showed sinus rhythm with PACs. At that time felt syncope to be vasovagal and patient was discharged home.   Patient now present for follow up, accompanied by his oldest daughter at bedside.  Patient and family both contribute to HPI.  On 12/22 patient was having his breakfast at home when family noticed that he got very tired and had difficulty speaking, with questionable loss of consciousness.  Patient recovered quickly and was back to his normal self within a few minutes.  He was transported to the emergency department by EMS.  Work-up in the emergency department was relatively unremarkable.  Following an episode on both 12/26 and 12/20 patient had intermittent episodes of dysarthria and unsteady gait lasting several minutes at a time.  Notably patient has not been on aspirin for an unknown period of time, but resumed it 2 days ago when family noticed that it had inadvertently been discontinued.  Past Medical History:  Diagnosis Date   Allergy    vioxx   CAD (coronary artery disease)    Hyperlipidemia    pt states he never had  high cholesterol   Internal hemorrhoids    pt. states he does not have hemorrhoids   Knee osteoarthritis    pt. states he does not have osteo   Memory difficulty    Past Surgical History:  Procedure Laterality Date   CARPAL TUNNEL RELEASE Right 2011   CATARACT EXTRACTION Bilateral 2012   mild hearing deficit     TOTAL KNEE ARTHROPLASTY Left 2009   TOTAL KNEE ARTHROPLASTY Right 2011   WRIST FRACTURE SURGERY     Family History  Problem Relation Age of Onset   Colon cancer Neg Hx    Stomach cancer Neg Hx    Social History   Tobacco Use   Smoking status: Never   Smokeless tobacco: Never  Substance Use Topics   Alcohol use: Yes    Alcohol/week: 16.0 standard drinks    Types: 14 Standard drinks or equivalent, 1 Glasses of wine, 1 Cans of beer per week    Comment: 1 per day   Marital Status: Married ROS  Review of Systems  Constitutional: Negative for malaise/fatigue and weight gain.  Cardiovascular:  Negative for chest pain, claudication, dyspnea on exertion, leg swelling, near-syncope, orthopnea, palpitations, paroxysmal nocturnal dyspnea and syncope.  Respiratory:  Negative for shortness of breath.   Gastrointestinal:  Negative for melena.  Neurological:  Positive for disturbances in coordination. Negative for dizziness.       Intermittent dysarthria  Objective   Vitals with BMI 02/16/2021 02/08/2021 02/08/2021  Height $Remov'5\' 8"'ppKDaG$  - -  Weight  167 lbs - -  BMI 70.6 - -  Systolic 237 628 315  Diastolic 84 80 79  Pulse 65 64 68      Physical Exam Vitals reviewed.  Constitutional:      Comments: He is well-built and well-nourished in no acute distress, appears younger than stated age.  Neck:     Vascular: Carotid bruit (bilateral) present. No JVD.  Cardiovascular:     Rate and Rhythm: Normal rate and regular rhythm.     Pulses:          Carotid pulses are  on the left side with bruit.      Femoral pulses are 2+ on the right side and 2+ on the left side.      Popliteal  pulses are 2+ on the right side and 2+ on the left side.       Dorsalis pedis pulses are 1+ on the right side and 1+ on the left side.       Posterior tibial pulses are 1+ on the right side and 1+ on the left side.     Heart sounds: Murmur heard.  Harsh midsystolic murmur is present with a grade of 2/6 at the upper right sternal border radiating to the neck.    No gallop.  Pulmonary:     Effort: Pulmonary effort is normal.     Breath sounds: Normal breath sounds.  Musculoskeletal:     Right lower leg: No edema.     Left lower leg: No edema.  Neurological:     General: No focal deficit present.     Mental Status: He is oriented to person, place, and time.     Cranial Nerves: No cranial nerve deficit.  Physical exam is unchanged compared to previous office visit.  Laboratory examination:    External Labs:  Labs 02/22/2020:  Serum glucose 90 mg, BUN 17, creatinine 0.9, EGFR 80 mL, potassium 4.6, CMP normal.  Hb 14.8/HCT 45.6, platelets 249.  Normal indicis.  Total cholesterol 167, triglycerides 90, HDL 62, LDL 87, non-HDL cholesterol 105.  APO B 89, upper normal.  Lipid Panel completed 02/16/2019 Cholesterol, total 163.000 m 02/16/2019 Triglycerides 76.000 02/16/2019 HDL 65 MG/DL 02/16/2019 LDL 83.000 mg 02/16/2019 NHDL 98  PSA 1.242 ng/ 02/16/2019  Glucose Random 101.000 m 02/16/2019  BUN 15.000 mg 02/16/2019 Creatinine, Serum 0.800 mg/ 02/16/2019  Allergies   Allergies  Allergen Reactions   Vioxx [Rofecoxib] Rash    Medications Prior to Visit:   Outpatient Medications Prior to Visit  Medication Sig Dispense Refill   allopurinol (ZYLOPRIM) 300 MG tablet Take 1 tablet by mouth.     aspirin EC 81 MG tablet Take 81 mg by mouth daily. Swallow whole.     memantine (NAMENDA) 5 MG tablet Take 5 mg by mouth at bedtime.     Misc. Devices MISC One touch verio test stripes #50Dx: E119     rosuvastatin (CRESTOR) 10 MG tablet Take 10 mg by mouth daily.     Apoaequorin  (PREVAGEN) 10 MG CAPS Take 1 capsule by mouth daily.     aspirin 325 MG tablet Take 325 mg by mouth daily.     No facility-administered medications prior to visit.   Final Medications at End of Visit    Current Meds  Medication Sig   allopurinol (ZYLOPRIM) 300 MG tablet Take 1 tablet by mouth.   aspirin EC 81 MG tablet Take 81 mg by mouth daily. Swallow whole.   clopidogrel (PLAVIX) 75 MG  tablet Take 1 tablet (75 mg total) by mouth daily.   memantine (NAMENDA) 5 MG tablet Take 5 mg by mouth at bedtime.   Misc. Devices MISC One touch verio test stripes #50Dx: E119   rosuvastatin (CRESTOR) 10 MG tablet Take 10 mg by mouth daily.   Radiology:  No results found.  CT head 02/08/2021: Mild atrophy.  No acute abnormality.  Cardiac Studies:   Nuclear stress test  09/29/2017: 1. The patient performed treadmill exercise using Bruce protocol, completing 4:58 minutes. The patient completed an estimated workload of 7.0 METS, reaching 102% of the maximum predicted heart rate. Normal hemodynamic response. No stress symptoms reported. Low exercise capacity. Stress electrocardiogram shows 1.5 mm horizontal ST depressions in leads V5,V6, II and upsloping 1-1.5 mm ST depressions in leads III, aVF that normalize within 1 min into recovery. Stress electrocardiogram is equivocal for ischemia. 2. The overall quality of the study is good. There is no evidence of abnormal lung activity. Stress and rest SPECT images demonstrate homogeneous tracer distribution throughout the myocardium. Gated SPECT imaging reveals normal myocardial thickening and wall motion. The left ventricular ejection fraction was normal (72%). 3. Low risk study.   Echocardiogram 06/07/2019:  Normal LV systolic function with visual EF 60-65%. Left ventricle cavity is normal in size. Normal global wall motion. Normal diastolic filling pattern, normal LAP. Calculated EF 57%.  Left atrial cavity is mildly dilated.  Mild calcification of the  aortic valve annulus. Mild aortic valve leaflet thickening with mild calcification. No evidence of aortic valve stenosis.  Mild (Grade I) mitral regurgitation.  Mild pulmonic regurgitation.  No significant change compared to prior study 11/05/2017.  Carotid artery duplex 12/13/2020:  Stenosis in the right internal carotid artery (50-69%). Stenosis in the right external carotid artery (>50%). Stenosis in the left internal carotid artery (50-69%). The left PSV internal/common carotid artery ratio of 4.27 is consistent with a stenosis of >70%. Stenosis in the left external carotid artery (<50%). Antegrade right vertebral artery flow. Antegrade left vertebral artery flow. Compared to 06/23/2020, there is slight progression of the disease severity in the left ICA. Follow up in six months is appropriate if clinically indicated.  EKG  02/16/2021: Sinus rhythm at a rate of 61 bpm.  Normal axis.  Poor R wave progression, cannot exclude anteroseptal infarct old.  Compared to EKG 12/21/2020, no significant change.  EKG 12/21/2020: Normal sinus rhythm at rate of 67 bpm, normal axis, poor R wave progression, probably normal variant however cannot exclude anteroseptal infarct old.  No significant change from 06/30/2020.  Assessment     ICD-10-CM   1. Syncope and collapse  R55 EKG 12-Lead    2. Bilateral carotid artery stenosis  I65.23 CT ANGIO NECK W OR WO CONTRAST    3. Dysarthria  R47.1 CT ANGIO NECK W OR WO CONTRAST    CANCELED: LONG TERM MONITOR (3-14 DAYS)    4. Other fatigue  R53.83 Urinalysis, Routine w reflex microscopic      Meds ordered this encounter  Medications   clopidogrel (PLAVIX) 75 MG tablet    Sig: Take 1 tablet (75 mg total) by mouth daily.    Dispense:  90 tablet    Refill:  3    Medications Discontinued During This Encounter  Medication Reason   Apoaequorin (PREVAGEN) 10 MG CAPS    aspirin 325 MG tablet     Recommendations:   Craig [name] is a 87 y.o. Caucasian  male with hypertension, hyperlipidemia, retinal artery branch occlusion  in December 2013 found to have moderate bilateral carotid artery stenosis, managed medically, hyperlipidemia.   Patient was last seen in our office 11/3/2022For annual follow-up.  Last office visit patient was stable from a cardiovascular standpoint and Zetia was added for improved lipid management.  He now presents for urgent visit following Syncopal episode 02/08/2021.  Patient was evaluated in the emergency department at that time.  Chest x-ray, head CT, and CBC and CMP were within normal limits at the ED.  EKG showed sinus rhythm with PACs. At that time felt syncope to be vasovagal and patient was discharged home.   Patient now present for follow up accompanied by his oldest daughter at bedside.  Patient's intermittent episodes of dysarthria and gait instability are suggestive of TIA, likely due to significant carotid artery stenosis.  Discussed management and evaluation options including cardiac monitor and referral to neurology.  However I discussed the case with Dr. Einar Gip who also reviewed patient records and he recommends first pursuing work-up for carotid artery stenosis.  Advised patient to continue aspirin and will add Plavix 75 mg p.o. daily.  Will obtain CTA of the neck and determine whether he would benefit from referral to vascular surgery following results of CTA.  At this time shared decision with patient, family, and Dr. Einar Gip was to hold off on cardiac monitor or referral to neurology, although could consider these in the future.  Patient's blood pressure is well controlled.  He is also on rosuvastatin, will continue this.  We will plan for repeat lipid profile testing following next office visit and consider addition of Zetia if LDL is not <70.  There is no clinical evidence of heart failure at today's office visit.  Physical exam and EKG remain unchanged.  Patient's family does express concern for UTI, I have  therefore placed urinalysis order, will defer further management and evaluation to PCP.  Follow-up in 4 weeks, sooner if needed.  Patient was seen in collaboration with Dr. Einar Gip and he is in agreement with the plan.    This was a 50-minute encounter with face-to-face counseling, medical records review, coordination of care, explanation of complex medical issues, complex medical decision making.     Alethia Berthold, PA-C 02/16/2021, 1:25 PM Office: [phone]

## 2021-02-16 ENCOUNTER — Other Ambulatory Visit: Payer: Self-pay

## 2021-02-16 ENCOUNTER — Encounter: Payer: Self-pay | Admitting: Student

## 2021-02-16 ENCOUNTER — Ambulatory Visit: Payer: PPO | Admitting: Student

## 2021-02-16 VITALS — BP 129/84 | HR 65 | Temp 98.0°F | Resp 16 | Ht 68.0 in | Wt 167.0 lb

## 2021-02-16 DIAGNOSIS — R55 Syncope and collapse: Secondary | ICD-10-CM | POA: Diagnosis not present

## 2021-02-16 DIAGNOSIS — R5383 Other fatigue: Secondary | ICD-10-CM | POA: Diagnosis not present

## 2021-02-16 DIAGNOSIS — R471 Dysarthria and anarthria: Secondary | ICD-10-CM | POA: Diagnosis not present

## 2021-02-16 DIAGNOSIS — I6523 Occlusion and stenosis of bilateral carotid arteries: Secondary | ICD-10-CM

## 2021-02-16 MED ORDER — CLOPIDOGREL BISULFATE 75 MG PO TABS: 75.0000 mg | ORAL_TABLET | Freq: Every day | ORAL | 3 refills | Status: DC

## 2021-02-20 DIAGNOSIS — H26491 Other secondary cataract, right eye: Secondary | ICD-10-CM | POA: Diagnosis not present

## 2021-02-21 ENCOUNTER — Ambulatory Visit
Admission: RE | Admit: 2021-02-21 | Discharge: 2021-02-21 | Disposition: A | Discharge status: Home / Self Care | Payer: PPO | Source: Ambulatory Visit | Attending: Student | Admitting: Student

## 2021-02-21 ENCOUNTER — Other Ambulatory Visit: Payer: Self-pay

## 2021-02-21 DIAGNOSIS — I6523 Occlusion and stenosis of bilateral carotid arteries: Secondary | ICD-10-CM | POA: Diagnosis not present

## 2021-02-21 DIAGNOSIS — R471 Dysarthria and anarthria: Secondary | ICD-10-CM

## 2021-02-21 DIAGNOSIS — I771 Stricture of artery: Secondary | ICD-10-CM | POA: Diagnosis not present

## 2021-02-21 DIAGNOSIS — I672 Cerebral atherosclerosis: Secondary | ICD-10-CM | POA: Diagnosis not present

## 2021-02-21 DIAGNOSIS — I6503 Occlusion and stenosis of bilateral vertebral arteries: Secondary | ICD-10-CM | POA: Diagnosis not present

## 2021-02-21 MED ORDER — IOPAMIDOL (ISOVUE-370) INJECTION 76%
75.0000 mL | Freq: Once | INTRAVENOUS | Status: AC | PRN
  Administered 2021-02-21: 60 mL via INTRAVENOUS

## 2021-02-22 NOTE — Addendum Note (Signed)
Addended by: Rosezena Sensor on: 02/22/2021 04:31 PM   Modules accepted: Orders

## 2021-03-14 NOTE — Progress Notes (Signed)
Primary Physician/Referring:  [name], MD  Patient ID: Craig [name], male    DOB: 1931/06/18, 88 y.o.   MRN: [id number]  Chief Complaint  Patient presents with   dysarthria   carotid stenosis    4 weeks    HPI:    Craig [name]  is a 88 y.o.  Caucasian male with hypertension, hyperlipidemia, retinal artery branch occlusion in December 2013 found to have moderate bilateral carotid artery stenosis, managed medically, hyperlipidemia.   Patient was last seen in our office 11/3/2022For annual follow-up.  Last office visit patient was stable from a cardiovascular standpoint and Zetia was added for improved lipid management.  He now presents for urgent visit following Syncopal episode 02/08/2021.  Patient was evaluated in the emergency department at that time.  Chest x-ray, head CT, and CBC and CMP were within normal limits at the ED.  EKG showed sinus rhythm with PACs. At that time felt syncope to be vasovagal and patient was discharged home.   Patient presents for 4-week follow-up.  Last office visit patient was experiencing intermittent episodes suggestive of TIA, therefore added Plavix.  Suspected this is related to underlying carotid artery stenosis, therefore ordered CT angio of the neck, details below.  Given significant stenosis and ongoing episodes suggestive of TIA patient was urgently referred to neurology for further evaluation and management recommendations.  Patient's case was discussed with Dr. Einar Gip step-by-step and CT reviewed with him as well, he is in agreement with the plan.   Patient is tolerating addition of Plavix without issue.  He has had no recurrence of symptoms suggestive of TIA.  He is currently scheduled to see neurology 03/27/2021.  Patient is accompanied by his daughter today's office visit who has started to manage more of his daily affairs including medications.  Notably patient has cognitively declined over the last few months.   Past Medical  History:  Diagnosis Date   Allergy    vioxx   CAD (coronary artery disease)    Hyperlipidemia    pt states he never had high cholesterol   Internal hemorrhoids    pt. states he does not have hemorrhoids   Knee osteoarthritis    pt. states he does not have osteo   Memory difficulty    Past Surgical History:  Procedure Laterality Date   CARPAL TUNNEL RELEASE Right 2011   CATARACT EXTRACTION Bilateral 2012   mild hearing deficit     TOTAL KNEE ARTHROPLASTY Left 2009   TOTAL KNEE ARTHROPLASTY Right 2011   WRIST FRACTURE SURGERY     Family History  Problem Relation Age of Onset   Heart attack Father 72   Heart disease Brother 35   Colon cancer Neg Hx    Stomach cancer Neg Hx    Social History   Tobacco Use   Smoking status: Former    Types: Cigarettes, E-cigarettes   Smokeless tobacco: Never   Tobacco comments:    Unsure how often. On and off  Substance Use Topics   Alcohol use: Yes    Alcohol/week: 16.0 standard drinks    Types: 14 Standard drinks or equivalent, 1 Glasses of wine, 1 Cans of beer per week    Comment: 1 per day   Marital Status: Married ROS  Review of Systems  Constitutional: Negative for malaise/fatigue and weight gain.  Cardiovascular:  Negative for chest pain, claudication, dyspnea on exertion, leg swelling, near-syncope, orthopnea, palpitations, paroxysmal nocturnal dyspnea and syncope.  Respiratory:  Negative for  shortness of breath.   Gastrointestinal:  Negative for melena.  Neurological:  Positive for disturbances in coordination (no recurrnece). Negative for dizziness.       No recurrence of dysarthria   Objective   Vitals with BMI 03/16/2021 02/16/2021 02/08/2021  Height _0  _1  -  Weight 167 lbs 3 oz 167 lbs -  BMI 28.36 62.9 -  Systolic 476 546 503  Diastolic 62 84 80  Pulse 76 65 64      Physical Exam Vitals reviewed.  Constitutional:      Comments: He is well-built and well-nourished in no acute distress, appears younger  than stated age.  Neck:     Vascular: Carotid bruit (bilateral) present. No JVD.  Cardiovascular:     Rate and Rhythm: Normal rate and regular rhythm.     Pulses:          Carotid pulses are  on the left side with bruit.      Femoral pulses are 2+ on the right side and 2+ on the left side.      Popliteal pulses are 2+ on the right side and 2+ on the left side.       Dorsalis pedis pulses are 1+ on the right side and 1+ on the left side.       Posterior tibial pulses are 1+ on the right side and 1+ on the left side.     Heart sounds: Murmur heard.  Harsh midsystolic murmur is present with a grade of 2/6 at the upper right sternal border radiating to the neck.    No gallop.  Pulmonary:     Effort: Pulmonary effort is normal.     Breath sounds: Normal breath sounds.  Musculoskeletal:     Right lower leg: No edema.     Left lower leg: No edema.  Neurological:     General: No focal deficit present.     Mental Status: He is oriented to person, place, and time.     Cranial Nerves: No cranial nerve deficit.  Physical exam is unchanged compared to previous office visit.  Laboratory examination:    External Labs: 02/22/2020:  Serum glucose 90 mg, BUN 17, creatinine 0.9, EGFR 80 mL, potassium 4.6, CMP normal.  Hb 14.8/HCT 45.6, platelets 249.  Normal indicis.  Total cholesterol 167, triglycerides 90, HDL 62, LDL 87, non-HDL cholesterol 105.  APO B 89, upper normal.  Lipid Panel completed 02/16/2019 Cholesterol, total 163.000 m 02/16/2019 Triglycerides 76.000 02/16/2019 HDL 65 MG/DL 02/16/2019 LDL 83.000 mg 02/16/2019 NHDL 98  PSA 1.242 ng/ 02/16/2019  Glucose Random 101.000 m 02/16/2019  BUN 15.000 mg 02/16/2019 Creatinine, Serum 0.800 mg/ 02/16/2019  Allergies   Allergies  Allergen Reactions   Vioxx [Rofecoxib] Rash    Medications Prior to Visit:   Outpatient Medications Prior to Visit  Medication Sig Dispense Refill   allopurinol (ZYLOPRIM) 300 MG tablet Take 1  tablet by mouth.     aspirin EC 81 MG tablet Take 81 mg by mouth daily. Swallow whole.     clopidogrel (PLAVIX) 75 MG tablet Take 1 tablet (75 mg total) by mouth daily. 90 tablet 3   memantine (NAMENDA) 5 MG tablet Take 5 mg by mouth at bedtime.     Misc. Devices MISC One touch verio test stripes #50Dx: E119     rosuvastatin (CRESTOR) 10 MG tablet Take 10 mg by mouth daily.     No facility-administered medications prior to visit.   Final Medications at  End of Visit    Current Meds  Medication Sig   allopurinol (ZYLOPRIM) 300 MG tablet Take 1 tablet by mouth.   aspirin EC 81 MG tablet Take 81 mg by mouth daily. Swallow whole.   clopidogrel (PLAVIX) 75 MG tablet Take 1 tablet (75 mg total) by mouth daily.   memantine (NAMENDA) 5 MG tablet Take 5 mg by mouth at bedtime.   Misc. Devices MISC One touch verio test stripes #50Dx: E119   rosuvastatin (CRESTOR) 10 MG tablet Take 10 mg by mouth daily.   Radiology:  No results found.  CT head 02/08/2021: Mild atrophy.  No acute abnormality.  CTA head/neck 02/21/2021:  1. Focal non opacification of the proximal left ICA, likely near occlusive stenosis, with immediate faint reconstitution and faint enhancement extending through the mid and distal extracranial left ICA to the intracranial segment. In addition, there is approximately 70% stenosis at the bifurcation and origin of the ICA. 2. Approximately 50% stenosis of the proximal right ICA. 3. Approximately 50% stenosis in the proximal left subclavian artery. 4. Moderate narrowing at the origin of the right vertebral artery.  Cardiac Studies:   Nuclear stress test  09/29/2017: 1. The patient performed treadmill exercise using Bruce protocol, completing 4:58 minutes. The patient completed an estimated workload of 7.0 METS, reaching 102% of the maximum predicted heart rate. Normal hemodynamic response. No stress symptoms reported. Low exercise capacity. Stress electrocardiogram shows 1.5 mm  horizontal ST depressions in leads V5,V6, II and upsloping 1-1.5 mm ST depressions in leads III, aVF that normalize within 1 min into recovery. Stress electrocardiogram is equivocal for ischemia. 2. The overall quality of the study is good. There is no evidence of abnormal lung activity. Stress and rest SPECT images demonstrate homogeneous tracer distribution throughout the myocardium. Gated SPECT imaging reveals normal myocardial thickening and wall motion. The left ventricular ejection fraction was normal (72%). 3. Low risk study.   Echocardiogram 06/07/2019:  Normal LV systolic function with visual EF 60-65%. Left ventricle cavity is normal in size. Normal global wall motion. Normal diastolic filling pattern, normal LAP. Calculated EF 57%.  Left atrial cavity is mildly dilated.  Mild calcification of the aortic valve annulus. Mild aortic valve leaflet thickening with mild calcification. No evidence of aortic valve stenosis.  Mild (Grade I) mitral regurgitation.  Mild pulmonic regurgitation.  No significant change compared to prior study 11/05/2017.  Carotid artery duplex 12/13/2020:  Stenosis in the right internal carotid artery (50-69%). Stenosis in the right external carotid artery (>50%). Stenosis in the left internal carotid artery (50-69%). The left PSV internal/common carotid artery ratio of 4.27 is consistent with a stenosis of >70%. Stenosis in the left external carotid artery (<50%). Antegrade right vertebral artery flow. Antegrade left vertebral artery flow. Compared to 06/23/2020, there is slight progression of the disease severity in the left ICA. Follow up in six months is appropriate if clinically indicated.  EKG   02/16/2021: Sinus rhythm at a rate of 61 bpm.  Normal axis.  Poor R wave progression, cannot exclude anteroseptal infarct old.  Compared to EKG 12/21/2020, no significant change.  EKG 12/21/2020: Normal sinus rhythm at rate of 67 bpm, normal axis, poor R wave  progression, probably normal variant however cannot exclude anteroseptal infarct old.  No significant change from 06/30/2020.  Assessment     ICD-10-CM   1. Bilateral carotid artery stenosis  I65.23     2. Dysarthria  R47.1     3. Hypercholesteremia  E78.00  No orders of the defined types were placed in this encounter.   There are no discontinued medications.   Recommendations:   Craig [name] is a 88 y.o. Caucasian male with hypertension, hyperlipidemia, retinal artery branch occlusion in December 2013 found to have moderate bilateral carotid artery stenosis, managed medically, hyperlipidemia.   Patient was last seen in our office 11/3/2022For annual follow-up.  Last office visit patient was stable from a cardiovascular standpoint and Zetia was added for improved lipid management.  He now presents for urgent visit following Syncopal episode 02/08/2021.  Patient was evaluated in the emergency department at that time.  Chest x-ray, head CT, and CBC and CMP were within normal limits at the ED.  EKG showed sinus rhythm with PACs. At that time felt syncope to be vasovagal and patient was discharged home.   Patient presents for 4-week follow-up.  Last office visit patient was experiencing intermittent episodes suggestive of TIA, therefore added Plavix.  Suspected this is related to underlying carotid artery stenosis, therefore ordered CT angio of the neck, details below.  Given significant stenosis and ongoing episodes suggestive of TIA patient was urgently referred to neurology for further evaluation and management recommendations.  Patient's case was discussed with Dr. Einar Gip step-by-step and CT reviewed with him as well, he is in agreement with the plan.  Patient has had no recurrence of TIA-like symptoms with aspirin and Plavix.  We will continue aspirin, Plavix, statin therapy.   Patient will see neurology early next month, appreciate their recommendations regarding TIA symptoms,  cognitive decline.   Follow up in 3 months, sooner if needed.   Patient was seen in collaboration with Dr. Einar Gip and he is in agreement with the plan.    Alethia Berthold, PA-C 03/16/2021, 11:19 AM Office: [phone]  Office: [phone]

## 2021-03-16 ENCOUNTER — Encounter: Payer: Self-pay | Admitting: Student

## 2021-03-16 ENCOUNTER — Ambulatory Visit: Payer: PPO | Admitting: Student

## 2021-03-16 ENCOUNTER — Other Ambulatory Visit: Payer: Self-pay

## 2021-03-16 VITALS — BP 125/62 | HR 76 | Temp 98.1°F | Resp 17 | Ht 68.0 in | Wt 167.2 lb

## 2021-03-16 DIAGNOSIS — E7800 Pure hypercholesterolemia, unspecified: Secondary | ICD-10-CM

## 2021-03-16 DIAGNOSIS — R471 Dysarthria and anarthria: Secondary | ICD-10-CM

## 2021-03-16 DIAGNOSIS — I6523 Occlusion and stenosis of bilateral carotid arteries: Secondary | ICD-10-CM | POA: Diagnosis not present

## 2021-03-27 ENCOUNTER — Ambulatory Visit: Payer: PPO | Admitting: Diagnostic Neuroimaging

## 2021-03-27 ENCOUNTER — Encounter: Payer: Self-pay | Admitting: Diagnostic Neuroimaging

## 2021-03-27 ENCOUNTER — Other Ambulatory Visit: Payer: Self-pay

## 2021-03-27 VITALS — BP 122/64 | HR 64 | Wt 168.0 lb

## 2021-03-27 DIAGNOSIS — F03B18 Unspecified dementia, moderate, with other behavioral disturbance: Secondary | ICD-10-CM | POA: Diagnosis not present

## 2021-03-27 DIAGNOSIS — I6523 Occlusion and stenosis of bilateral carotid arteries: Secondary | ICD-10-CM

## 2021-03-27 NOTE — Patient Instructions (Addendum)
°  ABNORMAL SPELLS - possible TIA vs pre-syncope - has bilateral carotid / ICA stenosis (70% on left, 50% on right) - agree with medical mgmt for now due to age and dementia - continue aspirin, plavix, statin  MEMORY LOSS (suspected moderate dementia with behavioral disturbance)  - 01/06/19 evaluation --> "Patient denies any problems and has poor insight.  He is tangential and argumentative in evaluation today.  Unfortunately wife is not here for evaluation although patient does not want wife involved in his care.  He does not want this information given to PCP.  I explained that I have to return my consult note to referring physician.  MMSE 15 out of 30." - continue memantine - safety / supervision issues reviewed - no driving; caution with medications and finances

## 2021-03-27 NOTE — Progress Notes (Signed)
[facility]  PATIENT: Craig [name] DOB: 04/05/1931  REFERRING CLINICIAN: Cantwell, Celeste C, PA* HISTORY FROM: patient and daughter REASON FOR VISIT: new consult   HISTORICAL  CHIEF COMPLAINT:  Chief Complaint  Patient presents with   New Patient (Initial Visit)    RM 58 with daughter [name] here for consult on TIA like symptoms and speech difficulties. Pt reports over all he has been feeling well no specific complaints or concerns today.    HISTORY OF PRESENT ILLNESS:   UPDATE (03/27/21, [facility]P): Since last visit, had 3 episodes of aphasia, weak all over then near-syncope / syncope. Spells lasted ~ 5 minutes. Went to ER for 1 episode (02/08/21). Had been off aspirin for some time. Now back on aspirin. Had carotid testing, and found to have 70% stenosis in left ICA. However not felt to be a surgical / stent candidate due to age and dementia.   PRIOR HPI (01/06/19): 88 year old male here for evaluation of memory loss.  Patient denies any memory problems.  Patient is here with son-in-law.  Patient canceled his appointments 3 times leading up to this evaluation today.  Apparently patient's wife has had some concerns about memory loss.  She is not here for this visit today.  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  He is somewhat argumentative and agitated today.  He feels that his PCP and wife do not know what they are talking about related to this memory problem.  He feels that he is fine and able to do all of his day-to-day routines.  Son-in-law also is not that concerned about memory loss issues.   REVIEW OF SYSTEMS: Full 14 system review of systems performed and negative with exception of: As per HPI.  ALLERGIES: Allergies  Allergen Reactions   Vioxx [Rofecoxib] Rash    HOME MEDICATIONS: Outpatient Medications Prior to Visit  Medication Sig Dispense Refill   allopurinol (ZYLOPRIM) 300 MG tablet Take 1 tablet by mouth.      aspirin EC 81 MG tablet Take 81 mg by mouth daily. Swallow whole.     clopidogrel (PLAVIX) 75 MG tablet Take 1 tablet (75 mg total) by mouth daily. 90 tablet 3   rosuvastatin (CRESTOR) 10 MG tablet Take 10 mg by mouth daily.     memantine (NAMENDA) 5 MG tablet Take 5 mg by mouth at bedtime.     Misc. Devices MISC One touch verio test stripes #50Dx: E119     No facility-administered medications prior to visit.    PAST MEDICAL HISTORY: Past Medical History:  Diagnosis Date   Allergy    vioxx   CAD (coronary artery disease)    Hyperlipidemia    pt states he never had high cholesterol   Internal hemorrhoids    pt. states he does not have hemorrhoids   Knee osteoarthritis    pt. states he does not have osteo   Memory difficulty     PAST SURGICAL HISTORY: Past Surgical History:  Procedure Laterality Date   CARPAL TUNNEL RELEASE Right 2011   CATARACT EXTRACTION Bilateral 2012   mild hearing deficit     TOTAL KNEE ARTHROPLASTY Left 2009   TOTAL KNEE ARTHROPLASTY Right 2011   WRIST FRACTURE SURGERY      FAMILY HISTORY: Family History  Problem Relation Age of Onset   Heart attack Father 71   Heart disease Brother 22   Colon cancer Neg Hx    Stomach cancer Neg Hx  SOCIAL HISTORY: Social History   Socioeconomic History   Marital status: Married    Spouse name: [name]   Number of children: 4   Years of education: college   Highest education level: Not on file  Occupational History    Comment: sells jewelry  Tobacco Use   Smoking status: Former    Types: Cigarettes, E-cigarettes   Smokeless tobacco: Never   Tobacco comments:    Unsure how often. On and off  Vaping Use   Vaping Use: Never used  Substance and Sexual Activity   Alcohol use: Yes    Alcohol/week: 16.0 standard drinks    Types: 14 Standard drinks or equivalent, 1 Glasses of wine, 1 Cans of beer per week    Comment: 1 per day   Drug use: No   Sexual activity: Not on file  Other Topics Concern    Not on file  Social History Narrative   01/06/19 Lives with wife   Caffeine- 2 daily   5 Children, 1 deceased   Right handed   Social Determinants of Health   Financial Resource Strain: Not on file  Food Insecurity: Not on file  Transportation Needs: Not on file  Physical Activity: Not on file  Stress: Not on file  Social Connections: Not on file  Intimate Partner Violence: Not on file     PHYSICAL EXAM  GENERAL EXAM/CONSTITUTIONAL: Vitals:  Vitals:   03/27/21 1019  BP: 122/64  Pulse: 64  SpO2: 97%  Weight: 168 lb (76.2 kg)   Body mass index is 25.54 kg/m. Wt Readings from Last 3 Encounters:  03/27/21 168 lb (76.2 kg)  03/16/21 167 lb 3.2 oz (75.8 kg)  02/16/21 167 lb (75.8 kg)   Patient is in no distress; well developed, nourished and groomed; neck is supple  CARDIOVASCULAR: Examination of carotid arteries is normal; no carotid bruits Regular rate and rhythm, no murmurs Examination of peripheral vascular system by observation and palpation is normal  EYES: Ophthalmoscopic exam of optic discs and posterior segments is normal; no papilledema or hemorrhages No results found.  MUSCULOSKELETAL: Gait, strength, tone, movements noted in Neurologic exam below  NEUROLOGIC: MENTAL STATUS:  MMSE - Mini Mental State Exam 01/06/2019  Orientation to time 4  Orientation to Place 3  Registration 3  Attention/ Calculation 1  Recall 0  Language- name 2 objects 1  Language- repeat 0  Language- follow 3 step command 3  Language- read & follow direction 0  Write a sentence 0  Copy design 0  Total score 15   awake, alert, oriented to person DECR memory intact DECR  attention and concentration language fluent, comprehension intact, naming intact fund of knowledge appropriate  CRANIAL NERVE:  2nd, 3rd, 4th, 6th - pupils equal and reactive to light, visual fields full to confrontation, extraocular muscles intact, no nystagmus 5th - facial sensation symmetric 7th  - facial strength symmetric 8th - hearing intact 9th - palate elevates symmetrically, uvula midline 11th - shoulder shrug symmetric 12th - tongue protrusion midline  MOTOR:  normal bulk and tone, full strength in the BUE, BLE; LIMITED IN RIGHT SHOULDER DUE TO ARTHRITIS  SENSORY:  normal and symmetric to light touch  COORDINATION:  finger-nose-finger, fine finger movements normal  REFLEXES:  deep tendon reflexes TRACE and symmetric  GAIT/STATION:  narrow based gait     DIAGNOSTIC DATA (LABS, IMAGING, TESTING) - I reviewed patient records, labs, notes, testing and imaging myself where available.  Lab Results  Component Value Date  WBC 8.2 02/08/2021   HGB 13.5 02/08/2021   HCT 42.2 02/08/2021   MCV 98.6 02/08/2021   PLT 153 02/08/2021      Component Value Date/Time   NA 142 02/08/2021 1500   K 4.0 02/08/2021 1500   CL 108 02/08/2021 1500   CO2 26 02/08/2021 1500   GLUCOSE 100 (H) 02/08/2021 1500   BUN 19 02/08/2021 1500   CREATININE 0.86 02/08/2021 1500   CALCIUM 9.6 02/08/2021 1500   PROT 6.7 02/08/2021 1500   ALBUMIN 4.5 02/08/2021 1500   AST 29 02/08/2021 1500   ALT 29 02/08/2021 1500   ALKPHOS 58 02/08/2021 1500   BILITOT 0.6 02/08/2021 1500   GFRNONAA >60 02/08/2021 1500   GFRAA  01/18/2010 0440    >60        The eGFR has been calculated using the MDRD equation. This calculation has not been validated in all clinical situations. eGFR's persistently <60 mL/min signify possible Chronic Kidney Disease.   Lab Results  Component Value Date   CHOL 161 10/15/2006   HDL 62.4 10/15/2006   LDLCALC 86 10/15/2006   TRIG 63 10/15/2006   CHOLHDL 2.6 CALC 10/15/2006   No results found for: HGBA1C No results found for: VITAMINB12 No results found for: TSH   02/07/12 MRI brain  1.  Normal MRI appearance of the brain for age. 2.  No acute or focal abnormality to explain the patient's symptoms. 3.  Mild sinus disease as described.    02/08/21 CT head   - Mild atrophy.  No acute abnormality.  02/21/21 CT angio neck 1. Focal non opacification of the proximal left ICA, likely near occlusive stenosis, with immediate faint reconstitution and faint enhancement extending through the mid and distal extracranial left ICA to the intracranial segment. In addition, there is approximately 70% stenosis at the bifurcation and origin of the ICA. 2. Approximately 50% stenosis of the proximal right ICA. 3. Approximately 50% stenosis in the proximal left subclavian artery. 4. Moderate narrowing at the origin of the right vertebral artery.    ASSESSMENT AND PLAN  88 y.o. year old male here for:  Dx:  1. Bilateral carotid artery stenosis   2. Moderate dementia with other behavioral disturbance, unspecified dementia type     PLAN:  ABNORMAL SPELLS - possible TIA vs pre-syncope - has bilateral carotid / ICA stenosis (70% on left, 50% on right) - agree with medical mgmt for now due to age and dementia - continue aspirin, plavix, statin  MEMORY LOSS (suspected moderate dementia with behavioral disturbance)  - 01/06/19 evaluation --> "Patient denies any problems and has poor insight.  He is tangential and argumentative in evaluation today.  Unfortunately wife is not here for evaluation although patient does not want wife involved in his care.  He does not want this information given to PCP.  I explained that I have to return my consult note to referring physician.  MMSE 15 out of 30." - continue memantine - safety / supervision issues reviewed - no driving; caution with medications and finances  Return for return to PCP, pending if symptoms worsen or fail to improve.  I spent 40 minutes of face-to-face and non-face-to-face time with patient.  This included previsit chart review, lab review, study review, order entry, electronic health record documentation, patient education.     Penni Bombard, MD 08/21/5327, 92:42 AM Certified in Neurology,  Neurophysiology and Neuroimaging  [facility] [address] [place], [place] [address] [phone]

## 2021-04-04 DIAGNOSIS — R2231 Localized swelling, mass and lump, right upper limb: Secondary | ICD-10-CM | POA: Diagnosis not present

## 2021-04-10 ENCOUNTER — Other Ambulatory Visit: Payer: Self-pay | Admitting: Internal Medicine

## 2021-04-10 DIAGNOSIS — R2231 Localized swelling, mass and lump, right upper limb: Secondary | ICD-10-CM

## 2021-04-19 ENCOUNTER — Ambulatory Visit
Admission: RE | Admit: 2021-04-19 | Discharge: 2021-04-19 | Disposition: A | Discharge status: Home / Self Care | Payer: PPO | Source: Ambulatory Visit | Attending: Internal Medicine | Admitting: Internal Medicine

## 2021-04-19 DIAGNOSIS — R2231 Localized swelling, mass and lump, right upper limb: Secondary | ICD-10-CM

## 2021-04-19 DIAGNOSIS — M85611 Other cyst of bone, right shoulder: Secondary | ICD-10-CM | POA: Diagnosis not present

## 2021-05-22 DIAGNOSIS — I6523 Occlusion and stenosis of bilateral carotid arteries: Secondary | ICD-10-CM | POA: Diagnosis not present

## 2021-05-22 DIAGNOSIS — R052 Subacute cough: Secondary | ICD-10-CM | POA: Diagnosis not present

## 2021-05-22 DIAGNOSIS — M25511 Pain in right shoulder: Secondary | ICD-10-CM | POA: Diagnosis not present

## 2021-05-22 DIAGNOSIS — I10 Essential (primary) hypertension: Secondary | ICD-10-CM | POA: Diagnosis not present

## 2021-05-22 DIAGNOSIS — E785 Hyperlipidemia, unspecified: Secondary | ICD-10-CM | POA: Diagnosis not present

## 2021-06-13 DIAGNOSIS — M25511 Pain in right shoulder: Secondary | ICD-10-CM | POA: Diagnosis not present

## 2021-06-13 DIAGNOSIS — M19011 Primary osteoarthritis, right shoulder: Secondary | ICD-10-CM | POA: Diagnosis not present

## 2021-06-18 DIAGNOSIS — E785 Hyperlipidemia, unspecified: Secondary | ICD-10-CM | POA: Diagnosis not present

## 2021-06-18 DIAGNOSIS — I10 Essential (primary) hypertension: Secondary | ICD-10-CM | POA: Diagnosis not present

## 2021-06-18 DIAGNOSIS — M109 Gout, unspecified: Secondary | ICD-10-CM | POA: Diagnosis not present

## 2021-06-18 DIAGNOSIS — Z125 Encounter for screening for malignant neoplasm of prostate: Secondary | ICD-10-CM | POA: Diagnosis not present

## 2021-06-20 ENCOUNTER — Ambulatory Visit: Payer: PPO

## 2021-06-20 DIAGNOSIS — I6523 Occlusion and stenosis of bilateral carotid arteries: Secondary | ICD-10-CM | POA: Diagnosis not present

## 2021-06-25 DIAGNOSIS — E785 Hyperlipidemia, unspecified: Secondary | ICD-10-CM | POA: Diagnosis not present

## 2021-06-25 DIAGNOSIS — H9193 Unspecified hearing loss, bilateral: Secondary | ICD-10-CM | POA: Diagnosis not present

## 2021-06-25 DIAGNOSIS — I6523 Occlusion and stenosis of bilateral carotid arteries: Secondary | ICD-10-CM | POA: Diagnosis not present

## 2021-06-25 DIAGNOSIS — M109 Gout, unspecified: Secondary | ICD-10-CM | POA: Diagnosis not present

## 2021-06-25 DIAGNOSIS — R413 Other amnesia: Secondary | ICD-10-CM | POA: Diagnosis not present

## 2021-06-25 DIAGNOSIS — Z0000 Encounter for general adult medical examination without abnormal findings: Secondary | ICD-10-CM | POA: Diagnosis not present

## 2021-06-25 DIAGNOSIS — H5461 Unqualified visual loss, right eye, normal vision left eye: Secondary | ICD-10-CM | POA: Diagnosis not present

## 2021-06-25 DIAGNOSIS — I10 Essential (primary) hypertension: Secondary | ICD-10-CM | POA: Diagnosis not present

## 2021-06-28 ENCOUNTER — Ambulatory Visit: Payer: PPO | Admitting: Cardiology

## 2021-06-28 ENCOUNTER — Encounter: Payer: Self-pay | Admitting: Cardiology

## 2021-06-28 VITALS — BP 124/68 | HR 65 | Temp 98.0°F | Resp 16 | Ht 68.0 in | Wt 159.0 lb

## 2021-06-28 DIAGNOSIS — I6522 Occlusion and stenosis of left carotid artery: Secondary | ICD-10-CM | POA: Diagnosis not present

## 2021-06-28 DIAGNOSIS — E7800 Pure hypercholesterolemia, unspecified: Secondary | ICD-10-CM

## 2021-06-28 DIAGNOSIS — I6523 Occlusion and stenosis of bilateral carotid arteries: Secondary | ICD-10-CM | POA: Diagnosis not present

## 2021-06-28 NOTE — Progress Notes (Signed)
? ?Primary Physician/Referring:  [name], MD ? ?Patient ID: Craig [name], male    DOB: [birth year], 88 y.o.   MRN: [id number] ? ?Chief Complaint  ?Patient presents with  ? BILATERAL CAROTID ARTERY STENOSIS  ? Follow-up  ?  3 months  ? ? ?HPI:   ? ?Craig [name]  is a 88 y.o.  Caucasian male with hypertension, hyperlipidemia, retinal artery branch occlusion in December 2013 found to have moderate bilateral carotid artery stenosis, managed medically, hyperlipidemia presents here for 6 month follow-up of carotid stenosis. ? ?He is accompanied by his son in law, He was seen in December 2022 for symptoms suggestive of TIA, he was started on Plavix and CT angiogram in January 23 revealed near occlusive left ICA stenosis, but patient had developed significant cognitive disorder hence medical therapy recommended.  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  He has no specific complaints today. ? ?Past Medical History:  ?Diagnosis Date  ? Allergy   ? vioxx  ? CAD (coronary artery disease)   ? Hyperlipidemia   ? pt states he never had high cholesterol  ? Internal hemorrhoids   ? pt. states he does not have hemorrhoids  ? Knee osteoarthritis   ? pt. states he does not have osteo  ? Memory difficulty   ? ?Past Surgical History:  ?Procedure Laterality Date  ? CARPAL TUNNEL RELEASE Right 2011  ? CATARACT EXTRACTION Bilateral 2012  ? mild hearing deficit    ? TOTAL KNEE ARTHROPLASTY Left 2009  ? TOTAL KNEE ARTHROPLASTY Right 2011  ? WRIST FRACTURE SURGERY    ? ?Family History  ?Problem Relation Age of Onset  ? Heart attack Father 21  ? Heart disease Brother 67  ? Colon cancer Neg Hx   ? Stomach cancer Neg Hx   ? ?Social History  ? ?Tobacco Use  ? Smoking status: Former  ?  Types: Cigarettes, E-cigarettes  ?  Smokeless tobacco: Never  ? Tobacco comments:  ?  Unsure how often. On and off  ?Substance Use Topics  ? Alcohol use: Yes  ?  Alcohol/week: 16.0 standard drinks  ?  Types: 14 Standard drinks or equivalent, 1 Glasses of wine, 1 Cans of beer per week  ?  Comment: 1 per day  ? ?Marital Status: Married ?ROS  ?Review of Systems  ?Cardiovascular:  Negative for chest pain, dyspnea on exertion and leg swelling.  ?Gastrointestinal:  Negative for melena.  ?Objective  ? ? ?  06/28/2021  ? 12:34 PM 03/27/2021  ? 10:19 AM 03/16/2021  ? 10:25 AM  ?Vitals with BMI  ?Height $Remove'5\' 8"'dNMnubu$   '5\' 8"'$   ?Weight 159 lbs 168 lbs 167 lbs 3 oz  ?BMI 24.18 25.55 25.43  ?Systolic 536 144 315  ?Diastolic 68 64 62  ?Pulse 65 64 76  ?  ?  ?Physical Exam ?Constitutional:   ?   Comments: He is well-built and well-nourished in no acute distress, appears younger than stated age.  ?Neck:  ?   Vascular: Carotid bruit (bilateral) present. No JVD.  ?Cardiovascular:  ?   Rate and Rhythm: Normal rate and regular rhythm.  ?   Pulses:     ?     Carotid pulses are  on the left side with bruit. ?     Femoral  pulses are 2+ on the right side and 2+ on the left side. ?     Popliteal pulses are 2+ on the right side and 2+ on the left side.  ?     Dorsalis pedis pulses are 1+ on the right side and 1+ on the left side.  ?     Posterior tibial pulses are 1+ on the right side and 1+ on the left side.  ?   Heart sounds: Murmur heard.  ?Harsh midsystolic murmur is present with a grade of 2/6 at the upper right sternal border radiating to the neck.  ?  No gallop.  ?Pulmonary:  ?   Effort: Pulmonary effort is normal.  ?   Breath sounds: Normal breath sounds.  ?Abdominal:  ?   General: Bowel sounds are normal.  ?   Palpations: Abdomen is soft.  ?Musculoskeletal:  ?   Right lower leg: No edema.  ?   Left lower leg: No edema.  ? ?Laboratory examination:  ?  ?External Labs: ?Cholesterol, total 135.000 m 06/18/2021 ?HDL 50.000 mg 06/18/2021 ?LDL 70.000 mg 06/18/2021 ?Triglycerides 76.000 mg  06/18/2021 ? ?Labs 02/22/2020: ? ?Serum glucose 90 mg, BUN 17, creatinine 0.9, EGFR 80 mL, potassium 4.6, CMP normal. ? ?Hb 14.8/HCT 45.6, platelets 249.  Normal indicis. ? ?Total cholesterol 167, triglycerides 90, HDL 62, LDL 87, non-HDL cholesterol 105.  APO B 89, upper normal. ? ?Medications and allergies  ? ?Allergies  ?Allergen Reactions  ? Vioxx [Rofecoxib] Rash  ?  ? ?Current Outpatient Medications:  ?  allopurinol (ZYLOPRIM) 300 MG tablet, Take 1 tablet by mouth., Disp: , Rfl:  ?  aspirin EC 81 MG tablet, Take 81 mg by mouth daily. Swallow whole., Disp: , Rfl:  ?  Carboxymeth-Glycerin-Polysorb 0.5-1-0.5 % SOLN, Place 1 drop into both eyes in the morning, at noon, and at bedtime., Disp: , Rfl:  ?  clopidogrel (PLAVIX) 75 MG tablet, Take 1 tablet (75 mg total) by mouth daily., Disp: 90 tablet, Rfl: 3 ?  fluticasone (FLONASE) 50 MCG/ACT nasal spray, Place 2 sprays into both nostrils daily., Disp: , Rfl:  ?  NON FORMULARY, Take 25 mg by mouth daily. CBD Delta 8 Gummies, Disp: , Rfl:  ?  NON FORMULARY, Apply 1 application. topically as needed. CBD Cream, Disp: , Rfl:  ?  NON FORMULARY, Dynamic brain supplement, Disp: , Rfl:  ?  rosuvastatin (CRESTOR) 10 MG tablet, Take 10 mg by mouth daily., Disp: , Rfl:   ?Radiology:  ?No results found.  ? ? ?Cardiac Studies:  ? ?Nuclear stress test  09/29/2017: ?1. The patient performed treadmill exercise using Bruce protocol, completing 4:58 minutes. The patient completed an estimated workload of 7.0 METS, reaching 102% of the maximum predicted heart rate. Normal hemodynamic response. No stress symptoms reported. Low exercise capacity. Stress electrocardiogram shows 1.5 mm horizontal ST depressions in leads V5,V6, II and upsloping 1-1.5 mm ST depressions in leads III, aVF that normalize within 1 min into recovery. Stress electrocardiogram is equivocal for ischemia. ?2. The overall quality of the study is good. There is no evidence of abnormal lung activity. Stress and rest SPECT  images demonstrate homogeneous tracer distribution throughout the myocardium. Gated SPECT imaging reveals normal myocardial thickening and wall motion. The left ventricular ejection fraction was normal (72%). ?3. Low risk study. ? ? ?Echocardiogram 06/07/2019:  ?Normal LV systolic function with visual EF 60-65%. Left ventricle cavity is normal in size. Normal global wall motion. Normal diastolic filling pattern, normal LAP. Calculated  EF 57%.  ?Left atrial cavity is mildly dilated.  ?Mild calcification of the aortic valve annulus. Mild aortic valve leaflet thickening with mild calcification. No evidence of aortic valve stenosis.  ?Mild (Grade I) mitral regurgitation.  ?Mild pulmonic regurgitation.  ?No significant change compared to prior study 11/05/2017. ? ?Carotid artery duplex 06/20/2021:  ?Duplex suggests stenosis in the right internal carotid artery (50-69%). Duplex suggests stenosis in the right external carotid artery (<50%).  ?Duplex suggests stenosis in the left internal carotid artery (total occlusion). Duplex suggests stenosis in the left external carotid artery (>50%).  ?Antegrade left vertebral artery flow.  ?Compared to the study done on 12/13/2020, there is progression of disease in the left carotid artery to complete occlusion from >70% stenosis.  This correlates with recent CT angiogram findings. Follow up in six months is appropriate if clinically indicated.  ? ?EKG  ? ?EKG 12/21/2020: Normal sinus rhythm at rate of 67 bpm, normal axis, poor R wave progression, probably normal variant however cannot exclude anteroseptal infarct old.  No significant change from 06/30/2020. ? ?Assessment  ? ?  ICD-10-CM   ?1. Bilateral carotid artery stenosis  I65.23 PCV CAROTID DUPLEX (BILATERAL)  ?  ?2. Left carotid artery occlusion  I65.22 PCV CAROTID DUPLEX (BILATERAL)  ?  ?3. Hypercholesteremia  E78.00   ?  ?  ?No orders of the defined types were placed in this encounter. ? ?There are no discontinued  medications. ? ?Recommendations:  ? ?Craig [name] is a 88 y.o. Caucasian male with hypertension, hyperlipidemia, retinal artery branch occlusion in December 2013 found to have moderate bilateral carotid artery stenos

## 2021-09-19 DIAGNOSIS — M25511 Pain in right shoulder: Secondary | ICD-10-CM | POA: Diagnosis not present

## 2021-09-19 DIAGNOSIS — M67411 Ganglion, right shoulder: Secondary | ICD-10-CM | POA: Diagnosis not present

## 2021-09-28 ENCOUNTER — Encounter: Payer: Self-pay | Admitting: Cardiology

## 2021-09-28 ENCOUNTER — Ambulatory Visit: Payer: PPO | Admitting: Cardiology

## 2021-09-28 VITALS — BP 138/74 | HR 70 | Temp 97.6°F | Resp 16 | Ht 68.0 in | Wt 153.0 lb

## 2021-09-28 DIAGNOSIS — I6523 Occlusion and stenosis of bilateral carotid arteries: Secondary | ICD-10-CM

## 2021-09-28 DIAGNOSIS — L723 Sebaceous cyst: Secondary | ICD-10-CM

## 2021-09-28 DIAGNOSIS — I739 Peripheral vascular disease, unspecified: Secondary | ICD-10-CM | POA: Diagnosis not present

## 2021-09-28 NOTE — Progress Notes (Signed)
Primary Physician/Referring:  [name], MD  Patient ID: Craig [name], male    DOB: 1931/09/19, 88 y.o.   MRN: [id number]  Chief Complaint  Patient presents with   Follow-up   Carotid stenosis    HPI:    Craig [name]  is a 88 y.o.  Caucasian male with hypertension, hyperlipidemia, retinal artery branch occlusion in December 2013 found to have moderate bilateral carotid artery stenosis, managed medically, hyperlipidemia presents here for 6 month follow-up of carotid stenosis.  He is accompanied by his son in law, He was seen in December 2022 for symptoms suggestive of TIA, he was started on Plavix and CT angiogram in January 23 revealed near occlusive left ICA stenosis, but patient had developed significant cognitive disorder hence medical therapy recommended.    He now presents for 86-monthoffice visit, his cognitive disorder has completely resolved.  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  No specific complaints today.  Past Medical History:  Diagnosis Date   Allergy    vioxx   CAD (coronary artery disease)    Hyperlipidemia    pt states he never had high cholesterol   Internal hemorrhoids    pt. states he does not have hemorrhoids   Knee osteoarthritis    pt. states he does not have osteo   Memory difficulty    Past Surgical History:  Procedure Laterality Date   CARPAL TUNNEL RELEASE Right 2011   CATARACT EXTRACTION Bilateral 2012   mild hearing deficit     TOTAL KNEE ARTHROPLASTY Left 2009   TOTAL KNEE ARTHROPLASTY Right 2011   WRIST FRACTURE SURGERY     Family History  Problem Relation Age of Onset   Heart attack Father 88  Heart disease Brother 920  Colon cancer Neg Hx    Stomach cancer Neg Hx    Social History   Tobacco Use   Smoking status: Former    Types: E-cigarettes, Cigars   Smokeless tobacco: Never   Tobacco comments:    Unsure  how often. On and off  Substance Use Topics   Alcohol use: Yes    Alcohol/week: 16.0 standard drinks of alcohol    Types: 14 Standard drinks or equivalent, 1 Glasses of wine, 1 Cans of beer per week    Comment: 1 per day   Marital Status: Married ROS  Review of Systems  Cardiovascular:  Negative for chest pain, dyspnea on exertion and leg swelling.  Gastrointestinal:  Negative for melena.   Objective      09/28/2021    1:03 PM 06/28/2021   12:34 PM 03/27/2021   10:19 AM  Vitals with BMI  Height '5\' 8"'$  '5\' 8"'$    Weight 153 lbs 159 lbs 168 lbs  BMI 23.27 [id number] Systolic [id number] Diastolic 74 68 64  Pulse 70 65 64      Physical Exam Constitutional:      Comments: He is well-built and well-nourished in no acute distress, appears younger than stated age.  Neck:     Vascular: Carotid bruit (bilateral) present. No JVD.  Cardiovascular:     Rate and Rhythm: Normal rate and regular rhythm.     Pulses:          Carotid pulses are  on the left side with bruit.      Femoral pulses are 2+ on the  right side and 2+ on the left side.      Popliteal pulses are 2+ on the right side and 2+ on the left side.       Dorsalis pedis pulses are 1+ on the right side and 1+ on the left side.       Posterior tibial pulses are 1+ on the right side and 1+ on the left side.     Heart sounds: Murmur heard.     Harsh midsystolic murmur is present with a grade of 2/6 at the upper right sternal border radiating to the neck.     No gallop.  Pulmonary:     Effort: Pulmonary effort is normal.     Breath sounds: Normal breath sounds.  Abdominal:     General: Bowel sounds are normal.     Palpations: Abdomen is soft.  Musculoskeletal:     Right lower leg: No edema.     Left lower leg: No edema.    Laboratory examination:    External Labs: Cholesterol, total 135.000 m 06/18/2021 HDL 50.000 mg 06/18/2021 LDL 70.000 mg 06/18/2021 Triglycerides 76.000 mg 06/18/2021  Labs 02/22/2020:  Serum glucose 90  mg, BUN 17, creatinine 0.9, EGFR 80 mL, potassium 4.6, CMP normal.  Hb 14.8/HCT 45.6, platelets 249.  Normal indicis.  Total cholesterol 167, triglycerides 90, HDL 62, LDL 87, non-HDL cholesterol 105.  APO B 89, upper normal.  Medications and allergies   Allergies  Allergen Reactions   Vioxx [Rofecoxib] Rash     Current Outpatient Medications:    allopurinol (ZYLOPRIM) 300 MG tablet, Take 1 tablet by mouth., Disp: , Rfl:    aspirin EC 81 MG tablet, Take 81 mg by mouth daily. Swallow whole., Disp: , Rfl:    Carboxymeth-Glycerin-Polysorb 0.5-1-0.5 % SOLN, Place 1 drop into both eyes in the morning, at noon, and at bedtime., Disp: , Rfl:    clopidogrel (PLAVIX) 75 MG tablet, Take 1 tablet (75 mg total) by mouth daily., Disp: 90 tablet, Rfl: 3   fluticasone (FLONASE) 50 MCG/ACT nasal spray, Place 2 sprays into both nostrils daily., Disp: , Rfl:    NON FORMULARY, Take 25 mg by mouth daily. CBD Delta 8 Gummies, Disp: , Rfl:    NON FORMULARY, Apply 1 application. topically as needed. CBD Cream, Disp: , Rfl:    NON FORMULARY, Dynamic brain supplement, Disp: , Rfl:    rosuvastatin (CRESTOR) 10 MG tablet, Take 10 mg by mouth daily., Disp: , Rfl:   Radiology:  No results found.    Cardiac Studies:   Nuclear stress test  09/29/2017: 1. The patient performed treadmill exercise using Bruce protocol, completing 4:58 minutes. The patient completed an estimated workload of 7.0 METS, reaching 102% of the maximum predicted heart rate. Normal hemodynamic response. No stress symptoms reported. Low exercise capacity. Stress electrocardiogram shows 1.5 mm horizontal ST depressions in leads V5,V6, II and upsloping 1-1.5 mm ST depressions in leads III, aVF that normalize within 1 min into recovery. Stress electrocardiogram is equivocal for ischemia. 2. The overall quality of the study is good. There is no evidence of abnormal lung activity. Stress and rest SPECT images demonstrate homogeneous tracer  distribution throughout the myocardium. Gated SPECT imaging reveals normal myocardial thickening and wall motion. The left ventricular ejection fraction was normal (72%). 3. Low risk study.   Echocardiogram 06/07/2019:  Normal LV systolic function with visual EF 60-65%. Left ventricle cavity is normal in size. Normal global wall motion. Normal diastolic filling pattern, normal LAP. Calculated  EF 57%.  Left atrial cavity is mildly dilated.  Mild calcification of the aortic valve annulus. Mild aortic valve leaflet thickening with mild calcification. No evidence of aortic valve stenosis.  Mild (Grade I) mitral regurgitation.  Mild pulmonic regurgitation.  No significant change compared to prior study 11/05/2017.  Carotid artery duplex 06/20/2021:  Duplex suggests stenosis in the right internal carotid artery (50-69%). Duplex suggests stenosis in the right external carotid artery (<50%).  Duplex suggests stenosis in the left internal carotid artery (total occlusion). Duplex suggests stenosis in the left external carotid artery (>50%).  Antegrade left vertebral artery flow.  Compared to the study done on 12/13/2020, there is progression of disease in the left carotid artery to complete occlusion from >70% stenosis.  This correlates with recent CT angiogram findings. Follow up in six months is appropriate if clinically indicated.   EKG   EKG 12/21/2020: Normal sinus rhythm at rate of 67 bpm, normal axis, poor R wave progression, probably normal variant however cannot exclude anteroseptal infarct old.  No significant change from 06/30/2020.  Assessment     ICD-10-CM   1. Bilateral carotid artery stenosis  I65.23     2. Peripheral artery disease (HCC)  I73.9     3. Sebaceous cyst  L72.3 Ambulatory referral to General Surgery        No orders of the defined types were placed in this encounter.  There are no discontinued medications.  Recommendations:   Craig [name] is a 88 y.o.  Caucasian male with hypertension, hyperlipidemia, retinal artery branch occlusion in December 2013 found to have moderate bilateral carotid artery stenosis, managed medically, hyperlipidemia presents here for 6 month follow-up of carotid stenosis.  He is accompanied by his son in law, He was seen in December 2022 for symptoms suggestive of TIA, he was started on Plavix and CT angiogram in January 23 revealed near occlusive left ICA stenosis, but patient had developed significant cognitive disorder hence medical therapy recommended.  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  I will continue aspirin and Plavix for now, is presently on Crestor, continue the same.  LDL is at goal.  It was a pleasure to see him today as he is return to complete baseline with regard to his mental status.  He is essentially asymptomatic.  He has a very large sebaceous cyst noted on his right shoulder, will refer him to see Dr. Romana Juniper for surgical consultation and probable excision.  If Plavix need to be held I do not have any contraindication for holding the Plavix for 5 days prior to surgery.  He has carotid artery duplex scheduled in a couple months, I will see him back in 3 months.  If he remains stable I will see him back on a 59-month basis.     Craig Prows, MD, [facility] 09/28/2021, 1:33 PM Office: [phone] Pager: [phone]

## 2021-10-23 DIAGNOSIS — M25511 Pain in right shoulder: Secondary | ICD-10-CM | POA: Diagnosis not present

## 2021-10-31 ENCOUNTER — Ambulatory Visit: Payer: Self-pay | Admitting: Surgery

## 2021-10-31 DIAGNOSIS — L729 Follicular cyst of the skin and subcutaneous tissue, unspecified: Secondary | ICD-10-CM | POA: Diagnosis not present

## 2021-10-31 NOTE — H&P (View-Only) (Signed)
Craig [name] [id number]   Referring Provider:  Laverda Page, MD   Subjective   Chief Complaint: cyst on shoulder     History of Present Illness:    Very pleasant 88 year old referred by his cardiologist Dr. Einar [name] for evaluation of a cystic mass on his right shoulder.  Patient has a history of hypertension, hyperlipidemia, carotid artery stenosis/ medically managed aspirin and Plavix. The cyst has been present for quite some time, and according to his son-in-law it has been lanced numerous times, beginning when it caused discomfort that prevented him from sleeping well for couple of days.  The drainage has been variable, on some occasions the classic curd-like material and on others more pink-tinged.   It has needed to be lanced several times due to discomfort, in addition to which it gets large enough that it begins to seep through and spontaneously drain, staining his clothing.  [name] and the patient are interested in formal excision as weekly trips to the doctor to have this drained are not a sustainable plan.   Review of Systems: A complete review of systems was obtained from the patient.  I have reviewed this information and discussed as appropriate with the patient.  See HPI as well for other ROS.   Medical History: Past Medical History:  Diagnosis Date   Arthritis    History of stroke    Hyperlipidemia     There is no problem list on file for this patient.   Past Surgical History:  Procedure Laterality Date   REPLACEMENT TOTAL KNEE BILATERAL       Allergies  Allergen Reactions   Vioxx [Rofecoxib] Rash    Current Outpatient Medications on File Prior to Visit  Medication Sig Dispense Refill   allopurinoL (ZYLOPRIM) 300 MG tablet Take 300 mg by mouth once daily     clopidogreL (PLAVIX) 75 mg tablet Take by mouth     aspirin 81 MG EC tablet Take by mouth     carboxymethylce-glycern-poly80 (REFRESH OPTIVE ADVANCED) 0.5-1-0.5 % Drop Apply to eye      rosuvastatin (CRESTOR) 10 MG tablet Take 10 mg by mouth once daily     No current facility-administered medications on file prior to visit.    Family History  Problem Relation Age of Onset   Breast cancer Mother    Stroke Father    Diabetes Son      Social History   Tobacco Use  Smoking Status Never  Smokeless Tobacco Not on file     Social History   Socioeconomic History   Marital status: Married  Tobacco Use   Smoking status: Never  Substance and Sexual Activity   Alcohol use: Yes   Drug use: Never    Objective:    Vitals:   10/31/21 1024  BP: 112/64  Pulse: 78  Temp: 36.9 C (98.4 F)  SpO2: 97%  Weight: 69 kg (152 lb 3.2 oz)  Height: 162.6 cm ('5\' 4"'$ )    Body mass index is 26.13 kg/m.  Alert, calm, cooperative.  Answering questions appropriately Unlabored respirations On the cephalad surface of the right shoulder there is an approximately 6 x 7 cm fluctuant and fairly mobile subcutaneous mass with a small eschar on the lateral aspect.  No active drainage, no evidence of infection.  Nontender.  Assessment and Plan:  Diagnoses and all orders for this visit:  Subcutaneous cyst  I think excision is completely reasonable.  We discussed the procedure including surgical technique, postoperative recovery, risks of  bleeding, infection, pain, scarring, injury to underlying structures, seroma/hematoma, and recurrence of the lesion.  Also discussed risk of undergoing anesthesia at his age with his medical comorbidities but I do think reasonable.  His cardiologist has already cleared him and is okay to hold Plavix for 5 days preop, see note in [facility] epic from 09/28/2021.  Okay to continue baby aspirin.  Craig Hugill Raquel James, MD

## 2021-10-31 NOTE — H&P (Signed)
Craig [name] [id number]   Referring Provider:  Laverda Page, MD   Subjective   Chief Complaint: cyst on shoulder     History of Present Illness:    Very pleasant 88 year old referred by his cardiologist Dr. Einar Gip for evaluation of a cystic mass on his right shoulder.  Patient has a history of hypertension, hyperlipidemia, carotid artery stenosis/ medically managed aspirin and Plavix. The cyst has been present for quite some time, and according to his son-in-law it has been lanced numerous times, beginning when it caused discomfort that prevented him from sleeping well for couple of days.  The drainage has been variable, on some occasions the classic curd-like material and on others more pink-tinged.   It has needed to be lanced several times due to discomfort, in addition to which it gets large enough that it begins to seep through and spontaneously drain, staining his clothing.  [name] and the patient are interested in formal excision as weekly trips to the doctor to have this drained are not a sustainable plan.   Review of Systems: A complete review of systems was obtained from the patient.  I have reviewed this information and discussed as appropriate with the patient.  See HPI as well for other ROS.   Medical History: Past Medical History:  Diagnosis Date   Arthritis    History of stroke    Hyperlipidemia     There is no problem list on file for this patient.   Past Surgical History:  Procedure Laterality Date   REPLACEMENT TOTAL KNEE BILATERAL       Allergies  Allergen Reactions   Vioxx [Rofecoxib] Rash    Current Outpatient Medications on File Prior to Visit  Medication Sig Dispense Refill   allopurinoL (ZYLOPRIM) 300 MG tablet Take 300 mg by mouth once daily     clopidogreL (PLAVIX) 75 mg tablet Take by mouth     aspirin 81 MG EC tablet Take by mouth     carboxymethylce-glycern-poly80 (REFRESH OPTIVE ADVANCED) 0.5-1-0.5 % Drop Apply to eye      rosuvastatin (CRESTOR) 10 MG tablet Take 10 mg by mouth once daily     No current facility-administered medications on file prior to visit.    Family History  Problem Relation Age of Onset   Breast cancer Mother    Stroke Father    Diabetes Son      Social History   Tobacco Use  Smoking Status Never  Smokeless Tobacco Not on file     Social History   Socioeconomic History   Marital status: Married  Tobacco Use   Smoking status: Never  Substance and Sexual Activity   Alcohol use: Yes   Drug use: Never    Objective:    Vitals:   10/31/21 1024  BP: 112/64  Pulse: 78  Temp: 36.9 C (98.4 F)  SpO2: 97%  Weight: 69 kg (152 lb 3.2 oz)  Height: 162.6 cm ('5\' 4"'$ )    Body mass index is 26.13 kg/m.  Alert, calm, cooperative.  Answering questions appropriately Unlabored respirations On the cephalad surface of the right shoulder there is an approximately 6 x 7 cm fluctuant and fairly mobile subcutaneous mass with a small eschar on the lateral aspect.  No active drainage, no evidence of infection.  Nontender.  Assessment and Plan:  Diagnoses and all orders for this visit:  Subcutaneous cyst  I think excision is completely reasonable.  We discussed the procedure including surgical technique, postoperative recovery, risks of  bleeding, infection, pain, scarring, injury to underlying structures, seroma/hematoma, and recurrence of the lesion.  Also discussed risk of undergoing anesthesia at his age with his medical comorbidities but I do think reasonable.  His cardiologist has already cleared him and is okay to hold Plavix for 5 days preop, see note in [facility] epic from 09/28/2021.  Okay to continue baby aspirin.  Romano Stigger Raquel James, MD

## 2021-11-08 ENCOUNTER — Encounter: Payer: Self-pay | Admitting: Cardiology

## 2021-11-17 NOTE — Progress Notes (Addendum)
COVID Vaccine received:  []  No [x]  Yes Date of any COVID positive Test in last 90 days:   None  PCP - Jarome Matin, MD Cardiologist - Yates Decamp, MD  Chest x-ray - 02-08-2021  Epic EKG -  02-16-2021  Epic Stress Test - n/a ECHO - 06-12-2019 Cardiac Cath - n/a  Pacemaker/ICD device     [x]  N/A Spinal Cord Stimulator:[x]  No []  Yes      (Remind patient to bring remote DOS) Other Implants:   History of Sleep Apnea? [x]  No []  Yes   Sleep Study Date:   CPAP used?- [x]  No []  Yes  (Instruct to bring their mask & Tubing)  Does the patient monitor blood sugar? []  No []  Yes  [x]  N/A  Blood Thinner Instructions:Plavix  Hold x 5 days per Dr. Verl Dicker 09-28-21 note in EPIC Aspirin Instructions: stay on ASA  ERAS Protocol Ordered: [x]  No  No Drink ordered  Comments: Medical POA: Daughter:  Craig [name]  [phone],  Contact sheet is on chart.  His daughter Craig [name] will be with him the DOS.   Activity level: Patient has a sitter each day for 12 hours from [facility]. He is very confused at his PST appt today. He needs to have his daughter be with him while in preop the DOS.   Anesthesia review: CAD, Murmur, HTN, Hx CVA, some memory loss/ dementia  Patient denies shortness of breath, fever, cough and chest pain at PAT appointment.  Patient verbalized understanding and agreement to the Pre-Surgical Instructions that were given to them at this PAT appointment. Patient was also educated of the need to review these PAT instructions again prior to his/her surgery.I reviewed the appropriate phone numbers to call if they have any and questions or concerns.

## 2021-11-17 NOTE — Patient Instructions (Signed)
SURGICAL WAITING ROOM VISITATION Patients having surgery or a procedure may have no more than 2 support people in the waiting area - these visitors may rotate in the visitor waiting room.   Children under the age of 77 must have an adult with them who is not the patient. If the patient needs to stay at the hospital during part of their recovery, the visitor guidelines for inpatient rooms apply.  PRE-OP VISITATION  Pre-op nurse will coordinate an appropriate time for 1 support person to accompany the patient in pre-op.  This support person may not rotate.  This visitor will be contacted when the time is appropriate for the visitor to come back in the pre-op area.  Please refer to the [facility] website for the visitor guidelines for Inpatients (after your surgery is over and you are in a regular room).  You are not required to quarantine at this time prior to your surgery. However, you must do this: Hand Hygiene often Do NOT share personal items Notify your provider if you are in close contact with someone who has COVID or you develop fever 100.4 or greater, new onset of sneezing, cough, sore throat, shortness of breath or body aches.   If you received a COVID test during your pre-op visit  it is requested that you wear a mask when out in public, stay away from anyone that may not be feeling well and notify your surgeon if you develop symptoms. If you test positive for Covid or have been in contact with anyone that has tested positive in the last 10 days please notify you surgeon.       Your procedure is scheduled on:  Monday  November 26, 2021  Report to [facility] Main Entrance.  Report to admitting at: 08:15 AM  +++++Call this number if you have any questions or problems the morning of surgery [phone]  Do not eat food :After Midnight the night prior to your surgery/procedure.  After Midnight you may have the following liquids until   07:30  AM DAY OF SURGERY  Clear  Liquid Diet Water Black Coffee (sugar ok, NO MILK/CREAM OR CREAMERS)  Tea (sugar ok, NO MILK/CREAM OR CREAMERS) regular and decaf                             Plain Jell-O  with no fruit (NO RED)                                           Fruit ices (not with fruit pulp, NO RED)                                     Popsicles (NO RED)                                                                  Juice: apple, WHITE grape, WHITE cranberry Sports drinks like Gatorade or Powerade (NO RED)  FOLLOW ANY ADDITIONAL PRE OP INSTRUCTIONS YOU RECEIVED FROM YOUR SURGEON'S OFFICE!!!   Oral Hygiene is also important to reduce your risk of infection.        Remember - BRUSH YOUR TEETH THE MORNING OF SURGERY WITH YOUR REGULAR TOOTHPASTE    Take ONLY these medicines the morning of surgery with A SIP OF WATER: Allopurinol and you may use your Flonase if needed.                    You may not have any metal on your body including  jewelry, and body piercing  Do not wear  lotions, powders,  cologne, or deodorant  Men may shave face and neck.   DO NOT [place]. PHARMACY WILL DISPENSE MEDICATIONS LISTED ON YOUR MEDICATION LIST TO YOU DURING YOUR ADMISSION [place]!   Patients discharged on the day of surgery will not be allowed to drive home.  Someone NEEDS to stay with you for the first 24 hours after anesthesia.  Special Instructions: Bring a copy of your healthcare power of attorney and living will documents the day of surgery, if you wish to have them scanned into your  Medical Records- EPIC  Please read over the following fact sheets you were given: IF YOU HAVE QUESTIONS ABOUT YOUR PRE-OP INSTRUCTIONS, PLEASE CALL [phone]  ([place])   [place] - Preparing for Surgery Before surgery, you can play an important role.  Because skin is not sterile, your skin needs to be as free of germs as possible.  You can reduce the number of  germs on your skin by washing with CHG (chlorahexidine gluconate) soap before surgery.  CHG is an antiseptic cleaner which kills germs and bonds with the skin to continue killing germs even after washing. Please DO NOT use if you have an allergy to CHG or antibacterial soaps.  If your skin becomes reddened/irritated stop using the CHG and inform your nurse when you arrive at Short Stay. Do not shave (including legs and underarms) for at least 48 hours prior to the first CHG shower.  You may shave your face/neck.  Please follow these instructions carefully:  1.  Shower with CHG Soap the night before surgery and the  morning of surgery.  2.  If you choose to wash your hair, wash your hair first as usual with your normal  shampoo.  3.  After you shampoo, rinse your hair and body thoroughly to remove the shampoo.                             4.  Use CHG as you would any other liquid soap.  You can apply chg directly to the skin and wash.  Gently with a scrungie or clean washcloth.  5.  Apply the CHG Soap to your body ONLY FROM THE NECK DOWN.   Do not use on face/ open                           Wound or open sores. Avoid contact with eyes, ears mouth and genitals (private parts).                       Wash face,  Genitals (private parts) with your normal soap.             6.  Wash thoroughly, paying  special attention to the area where your  surgery  will be performed.  7.  Thoroughly rinse your body with warm water from the neck down.  8.  DO NOT shower/wash with your normal soap after using and rinsing off the CHG Soap.            9.  Pat yourself dry with a clean towel.            10.  Wear clean pajamas.            11.  Place clean sheets on your bed the night of your first shower and do not  sleep with pets.  ON THE DAY OF SURGERY : Do not apply any lotions/deodorants the morning of surgery.  Please wear clean clothes to the hospital/surgery center.    FAILURE TO FOLLOW THESE INSTRUCTIONS MAY  RESULT IN THE CANCELLATION OF YOUR SURGERY  PATIENT SIGNATURE_________________________________  NURSE SIGNATURE__________________________________  ________________________________________________________________________

## 2021-11-20 ENCOUNTER — Encounter (HOSPITAL_COMMUNITY)
Admission: RE | Admit: 2021-11-20 | Discharge: 2021-11-20 | Disposition: A | Discharge status: Home / Self Care | Payer: PPO | Source: Ambulatory Visit | Attending: Surgery | Admitting: Surgery

## 2021-11-20 ENCOUNTER — Other Ambulatory Visit: Payer: Self-pay

## 2021-11-20 ENCOUNTER — Encounter (HOSPITAL_COMMUNITY): Payer: Self-pay

## 2021-11-20 VITALS — BP 114/68 | HR 70 | Temp 97.7°F | Resp 16 | Ht 67.0 in | Wt 159.0 lb

## 2021-11-20 DIAGNOSIS — I10 Essential (primary) hypertension: Secondary | ICD-10-CM | POA: Insufficient documentation

## 2021-11-20 DIAGNOSIS — Z01818 Encounter for other preprocedural examination: Secondary | ICD-10-CM | POA: Insufficient documentation

## 2021-11-20 HISTORY — DX: Malignant (primary) neoplasm, unspecified: C80.1

## 2021-11-20 HISTORY — DX: Essential (primary) hypertension: I10

## 2021-11-20 HISTORY — DX: Cerebral infarction, unspecified: I63.9

## 2021-11-20 LAB — CBC
HCT: 40.7 % (ref 39.0–52.0)
Hemoglobin: 12.8 g/dL — ABNORMAL LOW (ref 13.0–17.0)
MCH: 32.2 pg (ref 26.0–34.0)
MCHC: 31.4 g/dL (ref 30.0–36.0)
MCV: 102.3 fL — ABNORMAL HIGH (ref 80.0–100.0)
Platelets: 157 10*3/uL (ref 150–400)
RBC: 3.98 MIL/uL — ABNORMAL LOW (ref 4.22–5.81)
RDW: 13.1 % (ref 11.5–15.5)
WBC: 7.3 10*3/uL (ref 4.0–10.5)
nRBC: 0 % (ref 0.0–0.2)

## 2021-11-20 LAB — BASIC METABOLIC PANEL
Anion gap: 7 (ref 5–15)
BUN: 15 mg/dL (ref 8–23)
CO2: 25 mmol/L (ref 22–32)
Calcium: 9.6 mg/dL (ref 8.9–10.3)
Chloride: 107 mmol/L (ref 98–111)
Creatinine, Ser: 0.84 mg/dL (ref 0.61–1.24)
GFR, Estimated: >60 mL/min (ref >60)
Glucose, Bld: 119 mg/dL — ABNORMAL HIGH (ref 70–99)
Potassium: 4.8 mmol/L (ref 3.5–5.1)
Sodium: 139 mmol/L (ref 135–145)

## 2021-11-21 NOTE — Progress Notes (Signed)
Anesthesia Chart Review   Case: [id number] Date/Time: 11/26/21 1015   Procedure: EXCISION OF RIGHT SHOULDER SUBCUTANEOUS CYST (Right)   Anesthesia type: Monitor Anesthesia Care   Pre-op diagnosis: SUBCUTANEOUS CYST   Location: [facility] ROOM 05 / [facility]   Surgeons: Clovis Riley, MD       DISCUSSION:88 y.o. former smoker with h/o CAD, HTN, moderate bilateral carotid artery stenosis, Stroke, subcutaneous cyst scheduled for above procedure 11/26/2021 with Dr. Romana Juniper.   Pt last seen by cardiology 09/28/2021. Per OV note, "He is essentially asymptomatic.  He has a very large sebaceous cyst noted on his right shoulder, will refer him to see Dr. Romana Juniper for surgical consultation and probable excision.  If Plavix need to be held I do not have any contraindication for holding the Plavix for 5 days prior to surgery."  Discussed with Dr. Einar Gip who states pt can proceed with surgery before having upcoming repeat carotid doppler.  VS: BP 114/68 Comment: Right arm sitting  Pulse 70   Temp 36.5 C (Oral)   Resp 16   Ht '5\' 7"'$  (1.702 m)   Wt 72.1 kg   SpO2 99%   BMI 24.90 kg/m   PROVIDERS: [name], MD is PCP   Cardiologist - Adrian Prows, MD LABS: Labs reviewed: Acceptable for surgery. (all labs ordered are listed, but only abnormal results are displayed)  Labs Reviewed  BASIC METABOLIC PANEL - Abnormal; Notable for the following components:      Result Value   Glucose, Bld 119 (*)    All other components within normal limits  CBC - Abnormal; Notable for the following components:   RBC 3.98 (*)    Hemoglobin 12.8 (*)    MCV 102.3 (*)    All other components within normal limits     IMAGES: Carotid artery duplex 06/20/2021:  Duplex suggests stenosis in the right internal carotid artery (50-69%).  Duplex suggests stenosis in the right external carotid artery (<50%).  Duplex suggests stenosis in the left internal carotid artery (total  occlusion). Duplex suggests  stenosis in the left external carotid artery  (>50%).  Antegrade left vertebral artery flow.  Compared to the study done on 12/13/2020, there is progression of disease  in the left carotid artery to complete occlusion from >70% stenosis.  This  correlates with recent CT angiogram findings. Follow up in six months is  appropriate if clinically indicated.   EKG:   CV: Echocardiogram 06/07/2019:  Normal LV systolic function with visual EF 60-65%. Left ventricle cavity  is normal in size. Normal global wall motion. Normal diastolic filling  pattern, normal LAP. Calculated EF 57%.  Left atrial cavity is mildly dilated.  Mild calcification of the aortic valve annulus. Mild aortic valve leaflet  thickening with mild calcification. No evidence of aortic valve stenosis.  Mild (Grade I) mitral regurgitation.  Mild pulmonic regurgitation.  No significant change compared to prior study 11/05/2017. Past Medical History:  Diagnosis Date   Allergy    vioxx   CAD (coronary artery disease)    Cancer ([place])    skin cancers removed   Hyperlipidemia    pt states he never had high cholesterol   Hypertension    Internal hemorrhoids    pt. states he does not have hemorrhoids   Knee osteoarthritis    pt. states he does not have osteo   Memory difficulty    Stroke [facility])     Past Surgical History:  Procedure Laterality Date  CARPAL TUNNEL RELEASE Right 2011   CATARACT EXTRACTION Bilateral 2012   mild hearing deficit     TOTAL KNEE ARTHROPLASTY Left 2009   TOTAL KNEE ARTHROPLASTY Right 2011   WRIST FRACTURE SURGERY      MEDICATIONS:  allopurinol (ZYLOPRIM) 300 MG tablet   aspirin EC 81 MG tablet   Carboxymeth-Glycerin-Polysorb 0.5-1-0.5 % SOLN   clopidogrel (PLAVIX) 75 MG tablet   fluticasone (FLONASE) 50 MCG/ACT nasal spray   NON FORMULARY   NON FORMULARY   NON FORMULARY   rosuvastatin (CRESTOR) 10 MG tablet   No current facility-administered medications for this encounter.     Konrad Felix Ward, PA-C [facility] Pre-Surgical Testing [phone]

## 2021-11-22 NOTE — Anesthesia Preprocedure Evaluation (Addendum)
Anesthesia Evaluation  Patient identified by MRN, date of birth, ID band Patient awake    Reviewed: Allergy & Precautions, NPO status , Patient's Chart, lab work & pertinent test results  Airway Mallampati: II  TM Distance: >3 FB Neck ROM: Full    Dental  (+) Dental Advisory Given, Caps, Teeth Intact   Pulmonary neg pulmonary ROS, former smoker,    Pulmonary exam normal breath sounds clear to auscultation       Cardiovascular hypertension, + CAD  Normal cardiovascular exam Rhythm:Regular Rate:Normal     Neuro/Psych CVA    GI/Hepatic negative GI ROS, (+)     substance abuse  alcohol use,   Endo/Other  negative endocrine ROS  Renal/GU negative Renal ROS     Musculoskeletal  (+) Arthritis , Subcutaneous cyst    Abdominal   Peds  Hematology  (+) Blood dyscrasia (Plavix), anemia ,   Anesthesia Other Findings Day of surgery medications reviewed with the patient.  Reproductive/Obstetrics                           Anesthesia Physical Anesthesia Plan  ASA: 3  Anesthesia Plan: MAC   Post-op Pain Management: Tylenol PO (pre-op)*   Induction: Intravenous  PONV Risk Score and Plan: 1 and Propofol infusion, Treatment may vary due to age or medical condition and Ondansetron  Airway Management Planned: Natural Airway and Simple Face Mask  Additional Equipment:   Intra-op Plan:   Post-operative Plan:   Informed Consent: I have reviewed the patients History and Physical, chart, labs and discussed the procedure including the risks, benefits and alternatives for the proposed anesthesia with the patient or authorized representative who has indicated his/her understanding and acceptance.     Dental advisory given  Plan Discussed with: CRNA and Anesthesiologist  Anesthesia Plan Comments: (See PAT note 11/20/2021)       Anesthesia Quick Evaluation

## 2021-11-23 ENCOUNTER — Encounter (HOSPITAL_COMMUNITY): Payer: Self-pay | Admitting: Surgery

## 2021-11-26 ENCOUNTER — Encounter (HOSPITAL_COMMUNITY)
Admission: RE | Disposition: A | Discharge status: Home / Self Care | Payer: Self-pay | Source: Home / Self Care | Attending: Surgery

## 2021-11-26 ENCOUNTER — Encounter (HOSPITAL_COMMUNITY): Payer: Self-pay | Admitting: Surgery

## 2021-11-26 ENCOUNTER — Other Ambulatory Visit: Payer: Self-pay

## 2021-11-26 ENCOUNTER — Ambulatory Visit (HOSPITAL_COMMUNITY): Payer: PPO | Admitting: Physician Assistant

## 2021-11-26 ENCOUNTER — Ambulatory Visit (HOSPITAL_BASED_OUTPATIENT_CLINIC_OR_DEPARTMENT_OTHER): Payer: PPO | Admitting: Anesthesiology

## 2021-11-26 ENCOUNTER — Ambulatory Visit (HOSPITAL_COMMUNITY)
Admission: RE | Admit: 2021-11-26 | Discharge: 2021-11-26 | Disposition: A | Discharge status: Home / Self Care | Payer: PPO | Attending: Surgery | Admitting: Surgery

## 2021-11-26 DIAGNOSIS — Z87891 Personal history of nicotine dependence: Secondary | ICD-10-CM | POA: Diagnosis not present

## 2021-11-26 DIAGNOSIS — L729 Follicular cyst of the skin and subcutaneous tissue, unspecified: Secondary | ICD-10-CM | POA: Diagnosis not present

## 2021-11-26 DIAGNOSIS — I2510 Atherosclerotic heart disease of native coronary artery without angina pectoris: Secondary | ICD-10-CM | POA: Insufficient documentation

## 2021-11-26 DIAGNOSIS — M25811 Other specified joint disorders, right shoulder: Secondary | ICD-10-CM

## 2021-11-26 DIAGNOSIS — I10 Essential (primary) hypertension: Secondary | ICD-10-CM | POA: Diagnosis not present

## 2021-11-26 DIAGNOSIS — D649 Anemia, unspecified: Secondary | ICD-10-CM | POA: Insufficient documentation

## 2021-11-26 DIAGNOSIS — M67411 Ganglion, right shoulder: Secondary | ICD-10-CM | POA: Diagnosis not present

## 2021-11-26 DIAGNOSIS — L859 Epidermal thickening, unspecified: Secondary | ICD-10-CM | POA: Diagnosis not present

## 2021-11-26 DIAGNOSIS — M1990 Unspecified osteoarthritis, unspecified site: Secondary | ICD-10-CM | POA: Insufficient documentation

## 2021-11-26 DIAGNOSIS — M71311 Other bursal cyst, right shoulder: Secondary | ICD-10-CM | POA: Insufficient documentation

## 2021-11-26 HISTORY — PX: CYST EXCISION: SHX5701

## 2021-11-26 SURGERY — CYST REMOVAL
Anesthesia: Monitor Anesthesia Care | Site: Shoulder | Laterality: Right

## 2021-11-26 MED ORDER — ACETAMINOPHEN 650 MG RE SUPP: 650.0000 mg | RECTAL | Status: DC | PRN

## 2021-11-26 MED ORDER — CHLORHEXIDINE GLUCONATE 0.12 % MT SOLN
15.0000 mL | Freq: Once | OROMUCOSAL | Status: AC
  Administered 2021-11-26: 15 mL via OROMUCOSAL

## 2021-11-26 MED ORDER — BUPIVACAINE-EPINEPHRINE (PF) 0.25% -1:200000 IJ SOLN
INTRAMUSCULAR | Status: AC
  Filled 2021-11-26: qty 30

## 2021-11-26 MED ORDER — FENTANYL CITRATE PF 50 MCG/ML IJ SOSY: 25.0000 ug | PREFILLED_SYRINGE | INTRAMUSCULAR | Status: DC | PRN

## 2021-11-26 MED ORDER — OXYCODONE HCL 5 MG PO TABS: 5.0000 mg | ORAL_TABLET | ORAL | Status: DC | PRN

## 2021-11-26 MED ORDER — SODIUM CHLORIDE 0.9 % IV SOLN: 250.0000 mL | INTRAVENOUS | Status: DC | PRN

## 2021-11-26 MED ORDER — EPHEDRINE 5 MG/ML INJ
INTRAVENOUS | Status: AC
  Filled 2021-11-26: qty 5

## 2021-11-26 MED ORDER — TRAMADOL HCL 50 MG PO TABS: 50.0000 mg | ORAL_TABLET | Freq: Four times a day (QID) | ORAL | 0 refills | Status: AC | PRN

## 2021-11-26 MED ORDER — LIDOCAINE HCL (PF) 2 % IJ SOLN
INTRAMUSCULAR | Status: AC
  Filled 2021-11-26: qty 5

## 2021-11-26 MED ORDER — 0.9 % SODIUM CHLORIDE (POUR BTL) OPTIME
TOPICAL | Status: DC | PRN
  Administered 2021-11-26: 1000 mL

## 2021-11-26 MED ORDER — LACTATED RINGERS IV SOLN: INTRAVENOUS | Status: DC

## 2021-11-26 MED ORDER — ACETAMINOPHEN 325 MG PO TABS: 650.0000 mg | ORAL_TABLET | ORAL | Status: DC | PRN

## 2021-11-26 MED ORDER — BUPIVACAINE-EPINEPHRINE (PF) 0.25% -1:200000 IJ SOLN
INTRAMUSCULAR | Status: DC | PRN
  Administered 2021-11-26: 10 mL

## 2021-11-26 MED ORDER — CHLORHEXIDINE GLUCONATE 4 % EX LIQD: 60.0000 mL | Freq: Once | CUTANEOUS | Status: DC

## 2021-11-26 MED ORDER — PHENYLEPHRINE 80 MCG/ML (10ML) SYRINGE FOR IV PUSH (FOR BLOOD PRESSURE SUPPORT)
PREFILLED_SYRINGE | INTRAVENOUS | Status: DC | PRN
  Administered 2021-11-26 (×3): 80 ug via INTRAVENOUS

## 2021-11-26 MED ORDER — PROPOFOL 500 MG/50ML IV EMUL
INTRAVENOUS | Status: DC | PRN
  Administered 2021-11-26: 100 ug/kg/min via INTRAVENOUS

## 2021-11-26 MED ORDER — PHENYLEPHRINE 80 MCG/ML (10ML) SYRINGE FOR IV PUSH (FOR BLOOD PRESSURE SUPPORT)
PREFILLED_SYRINGE | INTRAVENOUS | Status: AC
  Filled 2021-11-26: qty 10

## 2021-11-26 MED ORDER — DEXAMETHASONE SODIUM PHOSPHATE 10 MG/ML IJ SOLN
INTRAMUSCULAR | Status: AC
  Filled 2021-11-26: qty 1

## 2021-11-26 MED ORDER — ONDANSETRON HCL 4 MG/2ML IJ SOLN: 4.0000 mg | Freq: Once | INTRAMUSCULAR | Status: DC | PRN

## 2021-11-26 MED ORDER — CEFAZOLIN SODIUM-DEXTROSE 2-4 GM/100ML-% IV SOLN
2.0000 g | INTRAVENOUS | Status: AC
  Administered 2021-11-26: 2 g via INTRAVENOUS
  Filled 2021-11-26: qty 100

## 2021-11-26 MED ORDER — FENTANYL CITRATE (PF) 250 MCG/5ML IJ SOLN
INTRAMUSCULAR | Status: DC | PRN
  Administered 2021-11-26: 25 ug via INTRAVENOUS

## 2021-11-26 MED ORDER — SODIUM CHLORIDE 0.9% FLUSH: 3.0000 mL | INTRAVENOUS | Status: DC | PRN

## 2021-11-26 MED ORDER — ONDANSETRON HCL 4 MG/2ML IJ SOLN
INTRAMUSCULAR | Status: DC | PRN
  Administered 2021-11-26: 4 mg via INTRAVENOUS

## 2021-11-26 MED ORDER — FENTANYL CITRATE (PF) 250 MCG/5ML IJ SOLN
INTRAMUSCULAR | Status: AC
  Filled 2021-11-26: qty 5

## 2021-11-26 MED ORDER — SODIUM CHLORIDE 0.9% FLUSH: 3.0000 mL | Freq: Two times a day (BID) | INTRAVENOUS | Status: DC

## 2021-11-26 MED ORDER — ONDANSETRON HCL 4 MG/2ML IJ SOLN
INTRAMUSCULAR | Status: AC
  Filled 2021-11-26: qty 2

## 2021-11-26 MED ORDER — ACETAMINOPHEN 500 MG PO TABS
1000.0000 mg | ORAL_TABLET | ORAL | Status: AC
  Administered 2021-11-26: 1000 mg via ORAL
  Filled 2021-11-26: qty 2

## 2021-11-26 MED ORDER — ORAL CARE MOUTH RINSE: 15.0000 mL | Freq: Once | OROMUCOSAL | Status: AC

## 2021-11-26 MED ORDER — EPHEDRINE SULFATE-NACL 50-0.9 MG/10ML-% IV SOSY
PREFILLED_SYRINGE | INTRAVENOUS | Status: DC | PRN
  Administered 2021-11-26: 10 mg via INTRAVENOUS
  Administered 2021-11-26 (×3): 5 mg via INTRAVENOUS

## 2021-11-26 MED ORDER — LIDOCAINE 2% (20 MG/ML) 5 ML SYRINGE
INTRAMUSCULAR | Status: DC | PRN
  Administered 2021-11-26: 40 mg via INTRAVENOUS

## 2021-11-26 SURGICAL SUPPLY — 36 items
ADH SKN CLS APL DERMABOND .7 (GAUZE/BANDAGES/DRESSINGS) ×1
APL PRP STRL LF DISP 70% ISPRP (MISCELLANEOUS) ×1
BAG COUNTER SPONGE SURGICOUNT (BAG) IMPLANT
BAG SPNG CNTER NS LX DISP (BAG)
BLADE SURG SZ10 CARB STEEL (BLADE) ×1 IMPLANT
BNDG GAUZE DERMACEA FLUFF 4 (GAUZE/BANDAGES/DRESSINGS) IMPLANT
BNDG GZE DERMACEA 4 6PLY (GAUZE/BANDAGES/DRESSINGS)
CHLORAPREP W/TINT 26 (MISCELLANEOUS) ×1 IMPLANT
COVER SURGICAL LIGHT HANDLE (MISCELLANEOUS) ×1 IMPLANT
DERMABOND ADVANCED .7 DNX12 (GAUZE/BANDAGES/DRESSINGS) IMPLANT
DRAIN PENROSE 0.25X18 (DRAIN) IMPLANT
DRAPE LAPAROTOMY TRNSV 102X78 (DRAPES) ×1 IMPLANT
ELECT REM PT RETURN 15FT ADLT (MISCELLANEOUS) ×1 IMPLANT
GAUZE 4X4 16PLY ~~LOC~~+RFID DBL (SPONGE) ×1 IMPLANT
GAUZE PAD ABD 8X10 STRL (GAUZE/BANDAGES/DRESSINGS) IMPLANT
GAUZE SPONGE 4X4 12PLY STRL (GAUZE/BANDAGES/DRESSINGS) IMPLANT
GLOVE BIO SURGEON STRL SZ 6 (GLOVE) ×1 IMPLANT
GLOVE INDICATOR 6.5 STRL GRN (GLOVE) ×1 IMPLANT
GLOVE SS BIOGEL STRL SZ 6 (GLOVE) ×1 IMPLANT
GOWN STRL REUS W/ TWL LRG LVL3 (GOWN DISPOSABLE) ×1 IMPLANT
GOWN STRL REUS W/ TWL XL LVL3 (GOWN DISPOSABLE) IMPLANT
GOWN STRL REUS W/TWL LRG LVL3 (GOWN DISPOSABLE) ×1
GOWN STRL REUS W/TWL XL LVL3 (GOWN DISPOSABLE)
KIT BASIN OR (CUSTOM PROCEDURE TRAY) ×1 IMPLANT
KIT TURNOVER KIT A (KITS) IMPLANT
MARKER SKIN DUAL TIP RULER LAB (MISCELLANEOUS) ×1 IMPLANT
NEEDLE HYPO 22GX1.5 SAFETY (NEEDLE) IMPLANT
PACK GENERAL/GYN (CUSTOM PROCEDURE TRAY) ×1 IMPLANT
SPIKE FLUID TRANSFER (MISCELLANEOUS) ×1 IMPLANT
SUT MNCRL AB 4-0 PS2 18 (SUTURE) ×1 IMPLANT
SUT PDS AB 3-0 SH 27 (SUTURE) IMPLANT
SUT VIC AB 3-0 SH 27 (SUTURE) ×1
SUT VIC AB 3-0 SH 27X BRD (SUTURE) IMPLANT
SYR CONTROL 10ML LL (SYRINGE) IMPLANT
TOWEL OR 17X26 10 PK STRL BLUE (TOWEL DISPOSABLE) ×1 IMPLANT
TOWEL OR NON WOVEN STRL DISP B (DISPOSABLE) ×1 IMPLANT

## 2021-11-26 NOTE — Transfer of Care (Signed)
Immediate Anesthesia Transfer of Care Note  Patient: Craig [name]  Procedure(s) Performed: EXCISION OF RIGHT SHOULDER SUBCUTANEOUS CYST (Right: Shoulder)  Patient Location: PACU  Anesthesia Type:MAC  Level of Consciousness: oriented, drowsy and patient cooperative  Airway & Oxygen Therapy: Patient Spontanous Breathing and Patient connected to face mask oxygen  Post-op Assessment: Report given to RN and Post -op Vital signs reviewed and stable  Post vital signs: Reviewed  Last Vitals:  Vitals Value Taken Time  BP 123/59 11/26/21 1150  Temp    Pulse 62 11/26/21 1152  Resp 13 11/26/21 1152  SpO2 96 % 11/26/21 1152  Vitals shown include unvalidated device data.  Last Pain:  Vitals:   11/26/21 0833  TempSrc:   PainSc: 0-No pain         Complications: No notable events documented.

## 2021-11-26 NOTE — Anesthesia Postprocedure Evaluation (Signed)
Anesthesia Post Note  Patient: Craig [name]  Procedure(s) Performed: EXCISION OF RIGHT SHOULDER SUBCUTANEOUS CYST (Right: Shoulder)     Patient location during evaluation: PACU Anesthesia Type: MAC Level of consciousness: awake and alert Pain management: pain level controlled Vital Signs Assessment: post-procedure vital signs reviewed and stable Respiratory status: spontaneous breathing, nonlabored ventilation, respiratory function stable and patient connected to nasal cannula oxygen Cardiovascular status: stable and blood pressure returned to baseline Postop Assessment: no apparent nausea or vomiting Anesthetic complications: no   No notable events documented.  Last Vitals:  Vitals:   11/26/21 1215 11/26/21 1231  BP: 104/85 126/66  Pulse: 63 63  Resp: 14 16  Temp:    SpO2: 99% 100%    Last Pain:  Vitals:   11/26/21 1215  TempSrc:   PainSc: 0-No pain                 Santa Lighter

## 2021-11-26 NOTE — Anesthesia Procedure Notes (Signed)
Procedure Name: MAC Date/Time: 11/26/2021 10:38 AM  Performed by: Jenne Campus, CRNAPre-anesthesia Checklist: Patient identified, Emergency Drugs available, Suction available and Patient being monitored Oxygen Delivery Method: Simple face mask

## 2021-11-26 NOTE — Interval H&P Note (Signed)
History and Physical Interval Note:  11/26/2021 8:34 AM  Craig [name]  has presented today for surgery, with the diagnosis of SUBCUTANEOUS CYST.  The various methods of treatment have been discussed with the patient and family. After consideration of risks, benefits and other options for treatment, the patient has consented to  Procedure(s): EXCISION OF RIGHT SHOULDER SUBCUTANEOUS CYST (Right) as a surgical intervention.  The patient's history has been reviewed, patient examined, no change in status, stable for surgery.  I have reviewed the patient's chart and labs.  Questions were answered to the patient's satisfaction.     Nalanie Winiecki Rich Brave

## 2021-11-26 NOTE — Discharge Instructions (Signed)
GENERAL SURGERY: POST OP INSTRUCTIONS  EAT Gradually transition to a high fiber diet with a fiber supplement over the next few weeks after discharge.  Start with a pureed / full liquid diet (see below)  WALK Walk an hour a day (cumulative, not all at once).  Control your pain to do that.    CONTROL PAIN Control pain so that you can walk, sleep, tolerate sneezing/coughing, go up/down stairs.  HAVE A BOWEL MOVEMENT DAILY Keep your bowels regular to avoid problems.  OK to try a laxative to override constipation.  OK to use an antidairrheal to slow down diarrhea.  Call if not better after 2 tries  CALL IF YOU HAVE PROBLEMS/CONCERNS Call if you are still struggling despite following these instructions. Call if you have concerns not answered by these instructions    DIET: Follow a light bland diet & liquids the first 24 hours after arrival home, such as soup, liquids, starches, etc.  Be sure to drink plenty of fluids.  Quickly advance to a usual solid diet within a few days.  Avoid fast food or heavy meals as your are more likely to get nauseated or have irregular bowels.  A low-sugar, high-fiber diet for the rest of your life is ideal.   Take your usually prescribed home medications unless otherwise directed. PAIN CONTROL: Pain is best controlled by a usual combination of three different methods TOGETHER: Ice/Heat Over the counter pain medication Prescription pain medication Most patients will experience some swelling and bruising around the incisions.  Ice packs or heating pads (30-60 minutes up to 6 times a day) will help. Use ice for the first few days to help decrease swelling and bruising, then switch to heat to help relax tight/sore spots and speed recovery.  Some people prefer to use ice alone, heat alone, alternating between ice & heat.  Experiment to what works for you.  Swelling and bruising can take several weeks to resolve.   It is helpful to take an over-the-counter pain  medication regularly for the first few weeks.  Choose one of the following that works best for you: Naproxen (Aleve, etc)  Two 220mg  tabs twice a day OR Ibuprofen (Advil, etc) Three 200mg  tabs four times a day (every meal & bedtime) AND Acetaminophen (Tylenol, etc) 500-650mg  four times a day (every meal & bedtime) A  prescription for pain medication (such as oxycodone, hydrocodone, etc) should be given to you upon discharge.  Take your pain medication as prescribed.  If you are having problems/concerns with the prescription medicine (does not control pain, nausea, vomiting, rash, itching, etc), please call us [phone] to see if we need to switch you to a different pain medicine that will work better for you and/or control your side effect better. If you need a refill on your pain medication, please contact your pharmacy.  They will contact our office to request authorization. Prescriptions will not be filled after 5 pm or on week-ends. Avoid getting constipated.  Between the surgery and the pain medications, it is common to experience some constipation.  Increasing fluid intake and taking a fiber supplement (such as Metamucil, Citrucel, FiberCon, MiraLax, etc) 1-2 times a day regularly will usually help prevent this problem from occurring.  A mild laxative (prune juice, Milk of Magnesia, MiraLax, etc) should be taken according to package directions if there are no bowel movements after 48 hours.   Wash / shower every day, starting 2 days after surgery.  You may shower over the  skin glue which is waterproof.  Continue to shower over incision(s) after the dressing is off.  No rubbing, scrubbing, lotions or ointments to incision.  Do not submerge incision. Glue will peel off after 1-2 weeks.  You may leave the incision open to air.  You may have skin tapes (Steri Strips) covering the incision(s).  Leave them on until one week, then remove.  Alternately you may have skin glue on the incision.  This will  flake off on its own.  You may replace a dressing/Band-Aid to cover the incision for comfort if you wish.      ACTIVITIES as tolerated:   You may resume regular (light) daily activities beginning the next day--such as daily self-care, walking, climbing stairs--gradually increasing activities as tolerated.  If you can walk 30 minutes without difficulty, it is safe to try more intense activity such as jogging, treadmill, bicycling, low-impact aerobics, swimming, etc. Save the most intensive and strenuous activity for last such as sit-ups, heavy lifting, contact sports, etc  Refrain from any heavy lifting or straining until you are off narcotics for pain control.   DO NOT PUSH THROUGH PAIN.  Let pain be your guide: If it hurts to do something, don't do it.  Pain is your body warning you to avoid that activity for another week until the pain goes down. You may drive when you are no longer taking prescription pain medication, you can comfortably wear a seatbelt, and you can safely maneuver your car and apply brakes. You may have sexual intercourse when it is comfortable.  FOLLOW UP in our office Please call [facility] at [phone] to set up an appointment to see your surgeon in the office for a follow-up appointment approximately 2-3 weeks after your surgery. Make sure that you call for this appointment the day you arrive home to insure a convenient appointment time. 9. IF YOU HAVE DISABILITY OR FAMILY LEAVE FORMS, BRING THEM TO THE OFFICE FOR PROCESSING.  DO NOT GIVE THEM TO YOUR DOCTOR.   WHEN TO CALL us [phone]: Poor pain control Reactions / problems with new medications (rash/itching, nausea, etc)  Fever over 101.5 F (38.5 C) Worsening swelling or bruising Continued bleeding from incision. Increased pain, redness, or drainage from the incision Difficulty breathing / swallowing   The clinic staff is available to answer your questions during regular business hours (8:30am-5pm).  Please  don't hesitate to call and ask to speak to one of our nurses for clinical concerns.   If you have a medical emergency, go to the nearest emergency room or call 911.  A surgeon from [facility] is always on call at the [facility], [place], [place], [place], [place]  [address] ? MAIN: [phone] ? TOLL FREE: [phone] ?  FAX [phone] [email or web address]

## 2021-11-26 NOTE — Op Note (Signed)
Operative Note  Craig [name]  [id number]  [id number]  11/26/2021   Surgeon: Romana Juniper MD FACS   Assistant: Malachi Pro PA-C   Procedure performed: excision of right shoulder ganglion vs synovial cyst 4x4x4cm   Preop diagnosis: right shoulder subcutaneous cyst Post-op diagnosis/intraop findings: synovial vs ganglion cyst communicating with AC joint   Specimens: cyst wall Retained items: no  EBL: minimal cc Complications: none   Description of procedure: After obtaining informed consent the patient was taken to the operating room and placed supine on operating room table where MAC was initiated, preoperative antibiotics were administered, SCDs applied, and a formal timeout was performed. The right shoulder and surrounding skin were prepped and draped in the usual sterile fashion. After infiltration with local (0.25% marcaine with epi) an elliptical incision was made over the cyst and the soft tissues dissected until the cyst wall was encountered. We proceeded to circumferentially free the cyst of the overlying skin and soft tissue with cautery. It was somewhat adherent to the overlying soft tissue. Along the deeper aspect of the cyst along the periosteum the cyst wall was thickened compared to the more superficial component and it was inseparable from the fascia and we did expose a small amount of the underlying trapezius muscle posteriorly. Ultimately the cyst was entered and contained clear gelatinous fluid, consistent with a synovial cyst that appeared to communicate with the [facility] joint. Intraoperative consultation with orthopedic surgery was performed and discussed most likely ganglion cyst.  The area of the suppose a neck of the cyst was cauterized with the Bovie and then thickened cyst wall at the base was closed over the [facility] joint with interrupted 4-0 PDS.  Hemostasis was ensured in the wound.  The skin was closed with interrupted deep dermal 3-0 Vicryl followed by running subcuticular  4 Monocryl and Dermabond.  The patient was then awakened and taken to PACU in stable condition.    All counts were correct at the completion of the case.

## 2021-11-27 ENCOUNTER — Encounter (HOSPITAL_COMMUNITY): Payer: Self-pay | Admitting: Surgery

## 2021-11-28 LAB — SURGICAL PATHOLOGY

## 2021-12-11 ENCOUNTER — Other Ambulatory Visit: Payer: PPO

## 2021-12-12 ENCOUNTER — Ambulatory Visit: Payer: PPO

## 2021-12-12 DIAGNOSIS — I6522 Occlusion and stenosis of left carotid artery: Secondary | ICD-10-CM

## 2021-12-12 DIAGNOSIS — I6523 Occlusion and stenosis of bilateral carotid arteries: Secondary | ICD-10-CM

## 2021-12-18 ENCOUNTER — Ambulatory Visit: Payer: PPO | Admitting: Cardiology

## 2021-12-18 ENCOUNTER — Encounter: Payer: Self-pay | Admitting: Cardiology

## 2021-12-18 VITALS — BP 95/79 | HR 60 | Resp 16 | Ht 67.0 in | Wt 156.4 lb

## 2021-12-18 DIAGNOSIS — E7800 Pure hypercholesterolemia, unspecified: Secondary | ICD-10-CM | POA: Diagnosis not present

## 2021-12-18 DIAGNOSIS — I6522 Occlusion and stenosis of left carotid artery: Secondary | ICD-10-CM

## 2021-12-18 DIAGNOSIS — I6523 Occlusion and stenosis of bilateral carotid arteries: Secondary | ICD-10-CM | POA: Diagnosis not present

## 2021-12-18 NOTE — Progress Notes (Signed)
Primary Physician/Referring:  Craig Lopes, MD  Patient ID: Craig [name], male    DOB: Sep 23, 1931, 88 y.o.   MRN: [id number]  Chief Complaint  Patient presents with   carotid stenosis   Follow-up    3 month    HPI:    Craig [name]  is a 88 y.o.  Caucasian male with hypertension, hyperlipidemia, retinal artery branch occlusion in December 2013 found to have moderate bilateral carotid artery stenosis, managed medically, hyperlipidemia presents here for 3  month follow-up of carotid stenosis, hyperlipidemia.  He is accompanied by his son in law. In December 2022 for symptoms suggestive of TIA, he was started on Plavix and CT angiogram in January 23 revealed near occlusive left ICA stenosis, but patient had developed significant cognitive disorder hence medical therapy recommended.    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  Past Medical History:  Diagnosis Date   Allergy    vioxx   CAD (coronary artery disease)    Cancer ([place])    skin cancers removed   Hyperlipidemia    pt states he never had high cholesterol   Hypertension    Internal hemorrhoids    pt. states he does not have hemorrhoids   Knee osteoarthritis    pt. states he does not have osteo   Memory difficulty    Stroke [facility])    Past Surgical History:  Procedure Laterality Date   CARPAL TUNNEL RELEASE Right 2011   CATARACT EXTRACTION Bilateral 2012   CYST EXCISION Right 11/26/2021   Procedure: EXCISION OF RIGHT SHOULDER SUBCUTANEOUS CYST;  Surgeon: Clovis Riley, MD;  Location: [facility];  Service: General;  Laterality: Right;   mild hearing deficit     TOTAL KNEE ARTHROPLASTY Left 2009   TOTAL KNEE ARTHROPLASTY Right 2011   WRIST FRACTURE SURGERY     Family History  Problem Relation Age of  Onset   Heart attack Father 40   Heart disease Brother 54   Colon cancer Neg Hx    Stomach cancer Neg Hx    Social History   Tobacco Use   Smoking status: Former    Types: E-cigarettes, Cigars   Smokeless tobacco: Never   Tobacco comments:    Unsure how often. On and off  Substance Use Topics   Alcohol use: Yes    Alcohol/week: 2.0 standard drinks of alcohol    Types: 1 Glasses of wine, 1 Cans of beer per week    Comment: 1 per day   Marital Status: Married ROS  Review of Systems  Cardiovascular:  Negative for chest pain, dyspnea on exertion and leg swelling.  Gastrointestinal:  Negative for melena.   Objective      12/18/2021    3:13 PM 11/26/2021   12:31 PM 11/26/2021   12:15 PM  Vitals with BMI  Height _0     Weight 156 lbs 6 oz    BMI 21.97    Systolic 95 588 325  Diastolic 79 66 85  Pulse 60 63 63      Physical Exam Constitutional:      Comments: He is well-built and well-nourished in no acute distress, appears younger than stated age.  Neck:     Vascular: Carotid bruit (bilateral) present. No JVD.  Cardiovascular:  Rate and Rhythm: Normal rate and regular rhythm.     Pulses:          Carotid pulses are  on the left side with bruit.      Femoral pulses are 2+ on the right side and 2+ on the left side.      Popliteal pulses are 2+ on the right side and 2+ on the left side.       Dorsalis pedis pulses are 1+ on the right side and 1+ on the left side.       Posterior tibial pulses are 1+ on the right side and 1+ on the left side.     Heart sounds: Murmur heard.     Harsh midsystolic murmur is present with a grade of 2/6 at the upper right sternal border radiating to the neck.     No gallop.  Pulmonary:     Effort: Pulmonary effort is normal.     Breath sounds: Normal breath sounds.  Abdominal:     General: Bowel sounds are normal.     Palpations: Abdomen is soft.  Musculoskeletal:     Right lower leg: No edema.     Left lower leg: No edema.     Laboratory examination:    External Labs: Cholesterol, total 135.000 m 06/18/2021 HDL 50.000 mg 06/18/2021 LDL 70.000 mg 06/18/2021 Triglycerides 76.000 mg 06/18/2021  Labs 02/22/2020:  Serum glucose 90 mg, BUN 17, creatinine 0.9, EGFR 80 mL, potassium 4.6, CMP normal.  Hb 14.8/HCT 45.6, platelets 249.  Normal indicis.  Total cholesterol 167, triglycerides 90, HDL 62, LDL 87, non-HDL cholesterol 105.  APO B 89, upper normal.  Medications and allergies   Allergies  Allergen Reactions   Vioxx [Rofecoxib] Rash     Current Outpatient Medications:    allopurinol (ZYLOPRIM) 300 MG tablet, Take 300 mg by mouth daily., Disp: , Rfl:    Carboxymeth-Glycerin-Polysorb 0.5-1-0.5 % SOLN, Place 2 drops into both eyes in the morning and at bedtime., Disp: , Rfl:    clopidogrel (PLAVIX) 75 MG tablet, Take 1 tablet (75 mg total) by mouth daily., Disp: 90 tablet, Rfl: 3   NON FORMULARY, Take 6.25 mg by mouth in the morning and at bedtime. CBD Delta 8 Gummies, Disp: , Rfl:    NON FORMULARY, Apply 1 application  topically as needed (pain). CBD Cream, Disp: , Rfl:    NON FORMULARY, Take 2 capsules by mouth daily. Dynamic brain supplement, Disp: , Rfl:    rosuvastatin (CRESTOR) 10 MG tablet, Take 10 mg by mouth daily., Disp: , Rfl:    vitamin B-12 (CYANOCOBALAMIN) 500 MCG tablet, Take 500 mcg by mouth daily., Disp: , Rfl:   Radiology:  No results found.    Cardiac Studies:   Nuclear stress test  09/29/2017: 1. The patient performed treadmill exercise using Bruce protocol, completing 4:58 minutes. The patient completed an estimated workload of 7.0 METS, reaching 102% of the maximum predicted heart rate. Normal hemodynamic response. No stress symptoms reported. Low exercise capacity. Stress electrocardiogram shows 1.5 mm horizontal ST depressions in leads V5,V6, II and upsloping 1-1.5 mm ST depressions in leads III, aVF that normalize within 1 min into recovery. Stress electrocardiogram is equivocal for  ischemia. 2. The overall quality of the study is good. There is no evidence of abnormal lung activity. Stress and rest SPECT images demonstrate homogeneous tracer distribution throughout the myocardium. Gated SPECT imaging reveals normal myocardial thickening and wall motion. The left ventricular ejection fraction was normal (72%).  3. Low risk study.   Echocardiogram 06/07/2019:  Normal LV systolic function with visual EF 60-65%. Left ventricle cavity is normal in size. Normal global wall motion. Normal diastolic filling pattern, normal LAP. Calculated EF 57%.  Left atrial cavity is mildly dilated.  Mild calcification of the aortic valve annulus. Mild aortic valve leaflet thickening with mild calcification. No evidence of aortic valve stenosis.  Mild (Grade I) mitral regurgitation.  Mild pulmonic regurgitation.  No significant change compared to prior study 11/05/2017.  Carotid artery duplex 06/20/2021:  Duplex suggests stenosis in the right internal carotid artery (50-69%). Duplex suggests stenosis in the right external carotid artery (<50%).  Duplex suggests stenosis in the left internal carotid artery (total occlusion). Duplex suggests stenosis in the left external carotid artery (>50%).  Antegrade left vertebral artery flow.  Compared to the study done on 12/13/2020, there is progression of disease in the left carotid artery to complete occlusion from >70% stenosis.  This correlates with recent CT angiogram findings. Follow up in six months is appropriate if clinically indicated.   EKG   EKG 12/21/2020: Normal sinus rhythm at rate of 67 bpm, normal axis, poor R wave progression, probably normal variant however cannot exclude anteroseptal infarct old.  No significant change from 06/30/2020.  Assessment     ICD-10-CM   1. Bilateral carotid artery stenosis  I65.23     2. Left carotid artery occlusion  I65.22     3. Hypercholesteremia  E78.00       No orders of the defined types were  placed in this encounter.  Medications Discontinued During This Encounter  Medication Reason   fluticasone (FLONASE) 50 MCG/ACT nasal spray    aspirin EC 81 MG tablet Discontinued by provider    Recommendations:   KORBIN MAPPS is a 88 y.o. Caucasian male with hypertension, hyperlipidemia, retinal artery branch occlusion in December 2013 found to have moderate bilateral carotid artery stenosis, managed medically, hyperlipidemia presents here for 3  month follow-up of carotid stenosis, hyperlipidemia.  He is accompanied by his son in law. In December 2022 for symptoms suggestive of TIA, he was started on Plavix and CT angiogram in January 23 revealed near occlusive left ICA stenosis, but patient had developed significant cognitive disorder hence medical therapy recommended.  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  I will continue  Plavix for now, and discontinue ASA. No change in carotid duplex and in view of his advanced age and stable disease will not do further surveillance.  Lipids are well controlled on Crestor, continue same.  On his last office visit due to a large  cyst on his upper back on the right, I referred him to be evaluated by surgery, he underwent Excision on 11/26/2021 of the cyst, pathology findings were consistent with bursitis/synovitis with reactive blood vessel proliferation.  No malignancy detected.  Stable from cardiac standpoint, I will see him back in 6 months.   Adrian Prows, MD, [facility] 12/18/2021, 9:29 PM Office: [phone] Pager: [phone]

## 2021-12-31 DIAGNOSIS — M25512 Pain in left shoulder: Secondary | ICD-10-CM | POA: Diagnosis not present

## 2021-12-31 DIAGNOSIS — I10 Essential (primary) hypertension: Secondary | ICD-10-CM | POA: Diagnosis not present

## 2021-12-31 DIAGNOSIS — R413 Other amnesia: Secondary | ICD-10-CM | POA: Diagnosis not present

## 2021-12-31 DIAGNOSIS — I6521 Occlusion and stenosis of right carotid artery: Secondary | ICD-10-CM | POA: Diagnosis not present

## 2021-12-31 DIAGNOSIS — E785 Hyperlipidemia, unspecified: Secondary | ICD-10-CM | POA: Diagnosis not present

## 2022-02-22 ENCOUNTER — Other Ambulatory Visit: Payer: Self-pay

## 2022-02-22 MED ORDER — CLOPIDOGREL BISULFATE 75 MG PO TABS: 75.0000 mg | ORAL_TABLET | Freq: Every day | ORAL | 3 refills | Status: DC

## 2022-02-26 DIAGNOSIS — Z961 Presence of intraocular lens: Secondary | ICD-10-CM | POA: Diagnosis not present

## 2022-02-26 DIAGNOSIS — H47011 Ischemic optic neuropathy, right eye: Secondary | ICD-10-CM | POA: Diagnosis not present

## 2022-05-29 DIAGNOSIS — C44622 Squamous cell carcinoma of skin of right upper limb, including shoulder: Secondary | ICD-10-CM | POA: Diagnosis not present

## 2022-06-18 ENCOUNTER — Ambulatory Visit: Payer: PPO | Admitting: Cardiology

## 2022-06-18 ENCOUNTER — Encounter: Payer: Self-pay | Admitting: Cardiology

## 2022-06-18 VITALS — BP 114/60 | HR 71 | Resp 16 | Ht 67.0 in | Wt 156.0 lb

## 2022-06-18 DIAGNOSIS — I6523 Occlusion and stenosis of bilateral carotid arteries: Secondary | ICD-10-CM | POA: Diagnosis not present

## 2022-06-18 DIAGNOSIS — E7800 Pure hypercholesterolemia, unspecified: Secondary | ICD-10-CM | POA: Diagnosis not present

## 2022-06-18 NOTE — Progress Notes (Signed)
Primary Physician/Referring:  Garlan Fillers, MD  Patient ID: Craig [name], male    DOB: 1932-01-08, 89 y.o.   MRN: [id number]  Chief Complaint  Patient presents with   Carotid stenosis   Hyperlipidemia   Follow-up    6 months    HPI:    MONTY SPICHER  is a 89 y.o.  Caucasian male with hypertension, hyperlipidemia, retinal artery branch occlusion in December 2013 found to have moderate bilateral carotid artery stenosis, managed medically, hyperlipidemia presents here for 3  month follow-up of carotid stenosis, hyperlipidemia.  He is accompanied by his son in law. In December 2022 for symptoms suggestive of TIA, he was started on Plavix and CT angiogram in January 23 revealed near occlusive left ICA stenosis, but patient had developed significant cognitive disorder hence medical therapy recommended.  Since then she has completely recuperated with regard to cognition, he has not had any TIA-like symptoms since being on Plavix, no dizziness or syncope. 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  Past Medical History:  Diagnosis Date   Allergy    vioxx   CAD (coronary artery disease)    Cancer (HCC)    skin cancers removed   Hyperlipidemia    pt states he never had high cholesterol   Hypertension    Internal hemorrhoids    pt. states he does not have hemorrhoids   Knee osteoarthritis    pt. states he does not have osteo   Memory difficulty    Stroke [facility])    Past Surgical History:  Procedure Laterality Date   CARPAL TUNNEL RELEASE Right 2011   CATARACT EXTRACTION Bilateral 2012   CYST EXCISION Right 11/26/2021   Procedure: EXCISION OF RIGHT SHOULDER SUBCUTANEOUS CYST;  Surgeon: Berna Bue, MD;  Location: [facility];  Service: General;  Laterality: Right;   mild hearing deficit     TOTAL KNEE ARTHROPLASTY Left 2009   TOTAL KNEE ARTHROPLASTY Right 2011   WRIST FRACTURE SURGERY      Family History  Problem Relation Age of Onset   Heart attack Father 23   Heart disease Brother 22   Colon cancer Neg Hx    Stomach cancer Neg Hx    Social History   Tobacco Use   Smoking status: Former    Types: E-cigarettes, Cigars   Smokeless tobacco: Never   Tobacco comments:    Unsure how often. On and off  Substance Use Topics   Alcohol use: Yes    Alcohol/week: 2.0 standard drinks of alcohol    Types: 1 Glasses of wine, 1 Cans of beer per week    Comment: 1 per day   Marital Status: Married ROS  Review of Systems  Cardiovascular:  Negative for chest pain, dyspnea on exertion and leg swelling.  Gastrointestinal:  Negative for melena.   Objective      06/18/2022    2:41 PM 12/18/2021    3:13 PM 11/26/2021   12:31 PM  Vitals with BMI  Height 5\' 7"  5\' 7"    Weight 156 lbs 156 lbs 6 oz   BMI 24.43 24.49   Systolic 114 95 126  Diastolic 60 79 66  Pulse 71 60 63      Physical Exam Constitutional:      Comments: He is well-built and well-nourished in no acute distress, appears younger than stated age.  Neck:     Vascular: Carotid bruit (bilateral)  present. No JVD.  Cardiovascular:     Rate and Rhythm: Normal rate and regular rhythm.     Pulses:          Carotid pulses are  on the left side with bruit.      Femoral pulses are 2+ on the right side and 2+ on the left side.      Popliteal pulses are 2+ on the right side and 2+ on the left side.       Dorsalis pedis pulses are 1+ on the right side and 1+ on the left side.       Posterior tibial pulses are 1+ on the right side and 1+ on the left side.     Heart sounds: Murmur heard.     Harsh midsystolic murmur is present with a grade of 2/6 at the upper right sternal border radiating to the neck.     No gallop.  Pulmonary:     Effort: Pulmonary effort is normal.     Breath sounds: Normal breath sounds.  Abdominal:     General: Bowel sounds are normal.     Palpations: Abdomen is soft.  Musculoskeletal:      Right lower leg: No edema.     Left lower leg: No edema.    Laboratory examination:    External Labs: Hemoglobin 12.800 g/d 11/20/2021 Platelets 157.000 K/ 11/20/2021  Creatinine, Serum 0.840 mg/ 11/20/2021 CrCl Est 52.88 11/20/2021 eGFR 91.400 06/18/2021 Potassium 4.800 mm 11/20/2021 ALT (SGPT) 41.000 IU/ 06/18/2021  Cholesterol, total 135.000 m 06/18/2021 HDL 50.000 mg 06/18/2021 LDL 70.000 mg 06/18/2021 Triglycerides 76.000 mg 06/18/2021  Radiology:  No results found.    Cardiac Studies:   Nuclear stress test  09/29/2017: 1. The patient performed treadmill exercise using Bruce protocol, completing 4:58 minutes. The patient completed an estimated workload of 7.0 METS, reaching 102% of the maximum predicted heart rate. Normal hemodynamic response. No stress symptoms reported. Low exercise capacity. Stress electrocardiogram shows 1.5 mm horizontal ST depressions in leads V5,V6, II and upsloping 1-1.5 mm ST depressions in leads III, aVF that normalize within 1 min into recovery. Stress electrocardiogram is equivocal for ischemia. 2. The overall quality of the study is good. There is no evidence of abnormal lung activity. Stress and rest SPECT images demonstrate homogeneous tracer distribution throughout the myocardium. Gated SPECT imaging reveals normal myocardial thickening and wall motion. The left ventricular ejection fraction was normal (72%). 3. Low risk study.   Echocardiogram 06/07/2019:  Normal LV systolic function with visual EF 60-65%. Left ventricle cavity is normal in size. Normal global wall motion. Normal diastolic filling pattern, normal LAP. Calculated EF 57%.  Left atrial cavity is mildly dilated.  Mild calcification of the aortic valve annulus. Mild aortic valve leaflet thickening with mild calcification. No evidence of aortic valve stenosis.  Mild (Grade I) mitral regurgitation.  Mild pulmonic regurgitation.  No significant change compared to prior study 11/05/2017.  Carotid  artery duplex 12/12/2021: Duplex suggests stenosis in the right internal carotid artery (50-69%). No evidence of significant stenosis in the right external carotid artery. Duplex suggests stenosis in the left internal carotid artery (near occlusion). No evidence of significant stenosis in the left external carotid artery. Antegrade right vertebral artery flow. Antegrade left vertebral artery flow. NO significant change since 06/20/2021. Left ICA is known occluded vessel. Follow up in four months is appropriate if clinically indicated.  EKG   EKG 06/18/2022: Normal sinus rhythm at the rate of 69 bpm, poor R progression, probably  normal variant.  No evidence of ischemia.  Compared to 02/16/2021, no significant change.   Medications and allergies   Allergies  Allergen Reactions   Vioxx [Rofecoxib] Rash    Current Outpatient Medications:    allopurinol (ZYLOPRIM) 300 MG tablet, Take 300 mg by mouth daily., Disp: , Rfl:    Carboxymeth-Glycerin-Polysorb 0.5-1-0.5 % SOLN, Place 2 drops into both eyes in the morning and at bedtime., Disp: , Rfl:    clopidogrel (PLAVIX) 75 MG tablet, Take 1 tablet (75 mg total) by mouth daily., Disp: 90 tablet, Rfl: 3   NON FORMULARY, Take 6.25 mg by mouth in the morning and at bedtime. CBD Delta 8 Gummies, Disp: , Rfl:    NON FORMULARY, Apply 1 application  topically as needed (pain). CBD Cream, Disp: , Rfl:    NON FORMULARY, Take 2 capsules by mouth daily. Dynamic brain supplement, Disp: , Rfl:    rosuvastatin (CRESTOR) 10 MG tablet, Take 10 mg by mouth daily., Disp: , Rfl:    vitamin B-12 (CYANOCOBALAMIN) 500 MCG tablet, Take 500 mcg by mouth daily., Disp: , Rfl:    Assessment     ICD-10-CM   1. Bilateral carotid artery stenosis  I65.23 EKG 12-Lead    2. Hypercholesteremia  E78.00       No orders of the defined types were placed in this encounter.  There are no discontinued medications.  Recommendations:   RAFIK KOPPEL is a 89 y.o. Caucasian  male with hypertension, hyperlipidemia, retinal artery branch occlusion in December 2013 found to have moderate bilateral carotid artery stenosis, managed medically, presented with TIA-like symptoms in December 2022, on Plavix chronically with no recurrence, hyperlipidemia presents here for 6  month follow-up.  1. Bilateral carotid artery stenosis I reviewed the results of the carotid artery duplex, she has moderate disease on the right and complete occlusion on the left and remains asymptomatic.  He is now 89 years of age.  Carotid duplex has remained stable over the past 1 year.  Will consider repeating this in a year but for now would like to see him back in 6 months.  Since being on Plavix, he has not had any recurrence of TIA-like symptoms.  He essentially remains asymptomatic.  There is no confusion, altered mental status, he has started maintaining his books as well with the help of his daughter.  He has not had any palpitations, dizziness or syncope.  - EKG 12-Lead  2. Hypercholesteremia I reviewed his external labs, lipids under excellent control.  No changes in the statins were performed today.  I will see him back in 6 months.  EKG is normal.    Yates Decamp, MD, [facility] 06/18/2022, 3:05 PM Office: [phone] Pager: [phone]

## 2022-07-16 DIAGNOSIS — R413 Other amnesia: Secondary | ICD-10-CM | POA: Diagnosis not present

## 2022-07-16 DIAGNOSIS — M25512 Pain in left shoulder: Secondary | ICD-10-CM | POA: Diagnosis not present

## 2022-07-16 DIAGNOSIS — I10 Essential (primary) hypertension: Secondary | ICD-10-CM | POA: Diagnosis not present

## 2022-07-16 DIAGNOSIS — M159 Polyosteoarthritis, unspecified: Secondary | ICD-10-CM | POA: Diagnosis not present

## 2022-07-18 DIAGNOSIS — H9193 Unspecified hearing loss, bilateral: Secondary | ICD-10-CM | POA: Diagnosis not present

## 2022-07-18 DIAGNOSIS — M542 Cervicalgia: Secondary | ICD-10-CM | POA: Diagnosis not present

## 2022-07-18 DIAGNOSIS — Z79899 Other long term (current) drug therapy: Secondary | ICD-10-CM | POA: Diagnosis not present

## 2022-07-18 DIAGNOSIS — Z7902 Long term (current) use of antithrombotics/antiplatelets: Secondary | ICD-10-CM | POA: Diagnosis not present

## 2022-07-18 DIAGNOSIS — M7022 Olecranon bursitis, left elbow: Secondary | ICD-10-CM | POA: Diagnosis not present

## 2022-07-18 DIAGNOSIS — M25512 Pain in left shoulder: Secondary | ICD-10-CM | POA: Diagnosis not present

## 2022-07-18 DIAGNOSIS — M109 Gout, unspecified: Secondary | ICD-10-CM | POA: Diagnosis not present

## 2022-07-18 DIAGNOSIS — R413 Other amnesia: Secondary | ICD-10-CM | POA: Diagnosis not present

## 2022-07-18 DIAGNOSIS — H34239 Retinal artery branch occlusion, unspecified eye: Secondary | ICD-10-CM | POA: Diagnosis not present

## 2022-07-18 DIAGNOSIS — I2510 Atherosclerotic heart disease of native coronary artery without angina pectoris: Secondary | ICD-10-CM | POA: Diagnosis not present

## 2022-07-18 DIAGNOSIS — M159 Polyosteoarthritis, unspecified: Secondary | ICD-10-CM | POA: Diagnosis not present

## 2022-07-18 DIAGNOSIS — I10 Essential (primary) hypertension: Secondary | ICD-10-CM | POA: Diagnosis not present

## 2022-07-18 DIAGNOSIS — G8929 Other chronic pain: Secondary | ICD-10-CM | POA: Diagnosis not present

## 2022-07-18 DIAGNOSIS — Z85828 Personal history of other malignant neoplasm of skin: Secondary | ICD-10-CM | POA: Diagnosis not present

## 2022-07-18 DIAGNOSIS — E7800 Pure hypercholesterolemia, unspecified: Secondary | ICD-10-CM | POA: Diagnosis not present

## 2022-07-18 DIAGNOSIS — I6523 Occlusion and stenosis of bilateral carotid arteries: Secondary | ICD-10-CM | POA: Diagnosis not present

## 2022-07-23 DIAGNOSIS — G8929 Other chronic pain: Secondary | ICD-10-CM | POA: Diagnosis not present

## 2022-07-23 DIAGNOSIS — H9193 Unspecified hearing loss, bilateral: Secondary | ICD-10-CM | POA: Diagnosis not present

## 2022-07-23 DIAGNOSIS — M25512 Pain in left shoulder: Secondary | ICD-10-CM | POA: Diagnosis not present

## 2022-07-23 DIAGNOSIS — I6523 Occlusion and stenosis of bilateral carotid arteries: Secondary | ICD-10-CM | POA: Diagnosis not present

## 2022-07-23 DIAGNOSIS — I10 Essential (primary) hypertension: Secondary | ICD-10-CM | POA: Diagnosis not present

## 2022-07-23 DIAGNOSIS — M159 Polyosteoarthritis, unspecified: Secondary | ICD-10-CM | POA: Diagnosis not present

## 2022-07-23 DIAGNOSIS — Z79899 Other long term (current) drug therapy: Secondary | ICD-10-CM | POA: Diagnosis not present

## 2022-07-23 DIAGNOSIS — M542 Cervicalgia: Secondary | ICD-10-CM | POA: Diagnosis not present

## 2022-07-23 DIAGNOSIS — E7800 Pure hypercholesterolemia, unspecified: Secondary | ICD-10-CM | POA: Diagnosis not present

## 2022-07-23 DIAGNOSIS — Z7902 Long term (current) use of antithrombotics/antiplatelets: Secondary | ICD-10-CM | POA: Diagnosis not present

## 2022-07-23 DIAGNOSIS — M109 Gout, unspecified: Secondary | ICD-10-CM | POA: Diagnosis not present

## 2022-07-23 DIAGNOSIS — M7022 Olecranon bursitis, left elbow: Secondary | ICD-10-CM | POA: Diagnosis not present

## 2022-07-23 DIAGNOSIS — Z85828 Personal history of other malignant neoplasm of skin: Secondary | ICD-10-CM | POA: Diagnosis not present

## 2022-07-23 DIAGNOSIS — H34239 Retinal artery branch occlusion, unspecified eye: Secondary | ICD-10-CM | POA: Diagnosis not present

## 2022-07-23 DIAGNOSIS — I2510 Atherosclerotic heart disease of native coronary artery without angina pectoris: Secondary | ICD-10-CM | POA: Diagnosis not present

## 2022-07-23 DIAGNOSIS — R413 Other amnesia: Secondary | ICD-10-CM | POA: Diagnosis not present

## 2022-07-29 DIAGNOSIS — M542 Cervicalgia: Secondary | ICD-10-CM | POA: Diagnosis not present

## 2022-07-29 DIAGNOSIS — M7022 Olecranon bursitis, left elbow: Secondary | ICD-10-CM | POA: Diagnosis not present

## 2022-07-29 DIAGNOSIS — Z7902 Long term (current) use of antithrombotics/antiplatelets: Secondary | ICD-10-CM | POA: Diagnosis not present

## 2022-07-29 DIAGNOSIS — G8929 Other chronic pain: Secondary | ICD-10-CM | POA: Diagnosis not present

## 2022-07-29 DIAGNOSIS — I2510 Atherosclerotic heart disease of native coronary artery without angina pectoris: Secondary | ICD-10-CM | POA: Diagnosis not present

## 2022-07-29 DIAGNOSIS — Z79899 Other long term (current) drug therapy: Secondary | ICD-10-CM | POA: Diagnosis not present

## 2022-07-29 DIAGNOSIS — I10 Essential (primary) hypertension: Secondary | ICD-10-CM | POA: Diagnosis not present

## 2022-07-29 DIAGNOSIS — H34239 Retinal artery branch occlusion, unspecified eye: Secondary | ICD-10-CM | POA: Diagnosis not present

## 2022-07-29 DIAGNOSIS — E7800 Pure hypercholesterolemia, unspecified: Secondary | ICD-10-CM | POA: Diagnosis not present

## 2022-07-29 DIAGNOSIS — R413 Other amnesia: Secondary | ICD-10-CM | POA: Diagnosis not present

## 2022-07-29 DIAGNOSIS — M25512 Pain in left shoulder: Secondary | ICD-10-CM | POA: Diagnosis not present

## 2022-07-29 DIAGNOSIS — M159 Polyosteoarthritis, unspecified: Secondary | ICD-10-CM | POA: Diagnosis not present

## 2022-07-29 DIAGNOSIS — H9193 Unspecified hearing loss, bilateral: Secondary | ICD-10-CM | POA: Diagnosis not present

## 2022-07-29 DIAGNOSIS — I6523 Occlusion and stenosis of bilateral carotid arteries: Secondary | ICD-10-CM | POA: Diagnosis not present

## 2022-07-29 DIAGNOSIS — M109 Gout, unspecified: Secondary | ICD-10-CM | POA: Diagnosis not present

## 2022-07-29 DIAGNOSIS — Z85828 Personal history of other malignant neoplasm of skin: Secondary | ICD-10-CM | POA: Diagnosis not present

## 2022-07-31 DIAGNOSIS — G8929 Other chronic pain: Secondary | ICD-10-CM | POA: Diagnosis not present

## 2022-07-31 DIAGNOSIS — M109 Gout, unspecified: Secondary | ICD-10-CM | POA: Diagnosis not present

## 2022-07-31 DIAGNOSIS — R413 Other amnesia: Secondary | ICD-10-CM | POA: Diagnosis not present

## 2022-07-31 DIAGNOSIS — I10 Essential (primary) hypertension: Secondary | ICD-10-CM | POA: Diagnosis not present

## 2022-07-31 DIAGNOSIS — I2510 Atherosclerotic heart disease of native coronary artery without angina pectoris: Secondary | ICD-10-CM | POA: Diagnosis not present

## 2022-07-31 DIAGNOSIS — Z79899 Other long term (current) drug therapy: Secondary | ICD-10-CM | POA: Diagnosis not present

## 2022-07-31 DIAGNOSIS — H9193 Unspecified hearing loss, bilateral: Secondary | ICD-10-CM | POA: Diagnosis not present

## 2022-07-31 DIAGNOSIS — Z7902 Long term (current) use of antithrombotics/antiplatelets: Secondary | ICD-10-CM | POA: Diagnosis not present

## 2022-07-31 DIAGNOSIS — Z85828 Personal history of other malignant neoplasm of skin: Secondary | ICD-10-CM | POA: Diagnosis not present

## 2022-07-31 DIAGNOSIS — E7800 Pure hypercholesterolemia, unspecified: Secondary | ICD-10-CM | POA: Diagnosis not present

## 2022-07-31 DIAGNOSIS — M542 Cervicalgia: Secondary | ICD-10-CM | POA: Diagnosis not present

## 2022-07-31 DIAGNOSIS — M159 Polyosteoarthritis, unspecified: Secondary | ICD-10-CM | POA: Diagnosis not present

## 2022-07-31 DIAGNOSIS — M25512 Pain in left shoulder: Secondary | ICD-10-CM | POA: Diagnosis not present

## 2022-07-31 DIAGNOSIS — H34239 Retinal artery branch occlusion, unspecified eye: Secondary | ICD-10-CM | POA: Diagnosis not present

## 2022-07-31 DIAGNOSIS — M7022 Olecranon bursitis, left elbow: Secondary | ICD-10-CM | POA: Diagnosis not present

## 2022-07-31 DIAGNOSIS — I6523 Occlusion and stenosis of bilateral carotid arteries: Secondary | ICD-10-CM | POA: Diagnosis not present

## 2022-08-05 DIAGNOSIS — Z7902 Long term (current) use of antithrombotics/antiplatelets: Secondary | ICD-10-CM | POA: Diagnosis not present

## 2022-08-05 DIAGNOSIS — Z79899 Other long term (current) drug therapy: Secondary | ICD-10-CM | POA: Diagnosis not present

## 2022-08-05 DIAGNOSIS — H9193 Unspecified hearing loss, bilateral: Secondary | ICD-10-CM | POA: Diagnosis not present

## 2022-08-05 DIAGNOSIS — I2510 Atherosclerotic heart disease of native coronary artery without angina pectoris: Secondary | ICD-10-CM | POA: Diagnosis not present

## 2022-08-05 DIAGNOSIS — I6523 Occlusion and stenosis of bilateral carotid arteries: Secondary | ICD-10-CM | POA: Diagnosis not present

## 2022-08-05 DIAGNOSIS — I10 Essential (primary) hypertension: Secondary | ICD-10-CM | POA: Diagnosis not present

## 2022-08-05 DIAGNOSIS — M159 Polyosteoarthritis, unspecified: Secondary | ICD-10-CM | POA: Diagnosis not present

## 2022-08-05 DIAGNOSIS — M542 Cervicalgia: Secondary | ICD-10-CM | POA: Diagnosis not present

## 2022-08-05 DIAGNOSIS — M7022 Olecranon bursitis, left elbow: Secondary | ICD-10-CM | POA: Diagnosis not present

## 2022-08-05 DIAGNOSIS — H34239 Retinal artery branch occlusion, unspecified eye: Secondary | ICD-10-CM | POA: Diagnosis not present

## 2022-08-05 DIAGNOSIS — M109 Gout, unspecified: Secondary | ICD-10-CM | POA: Diagnosis not present

## 2022-08-05 DIAGNOSIS — G8929 Other chronic pain: Secondary | ICD-10-CM | POA: Diagnosis not present

## 2022-08-05 DIAGNOSIS — E7800 Pure hypercholesterolemia, unspecified: Secondary | ICD-10-CM | POA: Diagnosis not present

## 2022-08-05 DIAGNOSIS — Z85828 Personal history of other malignant neoplasm of skin: Secondary | ICD-10-CM | POA: Diagnosis not present

## 2022-08-05 DIAGNOSIS — M25512 Pain in left shoulder: Secondary | ICD-10-CM | POA: Diagnosis not present

## 2022-08-05 DIAGNOSIS — R413 Other amnesia: Secondary | ICD-10-CM | POA: Diagnosis not present

## 2022-08-07 DIAGNOSIS — H34239 Retinal artery branch occlusion, unspecified eye: Secondary | ICD-10-CM | POA: Diagnosis not present

## 2022-08-07 DIAGNOSIS — M542 Cervicalgia: Secondary | ICD-10-CM | POA: Diagnosis not present

## 2022-08-07 DIAGNOSIS — R413 Other amnesia: Secondary | ICD-10-CM | POA: Diagnosis not present

## 2022-08-07 DIAGNOSIS — I6523 Occlusion and stenosis of bilateral carotid arteries: Secondary | ICD-10-CM | POA: Diagnosis not present

## 2022-08-07 DIAGNOSIS — Z79899 Other long term (current) drug therapy: Secondary | ICD-10-CM | POA: Diagnosis not present

## 2022-08-07 DIAGNOSIS — I10 Essential (primary) hypertension: Secondary | ICD-10-CM | POA: Diagnosis not present

## 2022-08-07 DIAGNOSIS — Z85828 Personal history of other malignant neoplasm of skin: Secondary | ICD-10-CM | POA: Diagnosis not present

## 2022-08-07 DIAGNOSIS — I2510 Atherosclerotic heart disease of native coronary artery without angina pectoris: Secondary | ICD-10-CM | POA: Diagnosis not present

## 2022-08-07 DIAGNOSIS — H9193 Unspecified hearing loss, bilateral: Secondary | ICD-10-CM | POA: Diagnosis not present

## 2022-08-07 DIAGNOSIS — M25512 Pain in left shoulder: Secondary | ICD-10-CM | POA: Diagnosis not present

## 2022-08-07 DIAGNOSIS — E7800 Pure hypercholesterolemia, unspecified: Secondary | ICD-10-CM | POA: Diagnosis not present

## 2022-08-07 DIAGNOSIS — M7022 Olecranon bursitis, left elbow: Secondary | ICD-10-CM | POA: Diagnosis not present

## 2022-08-07 DIAGNOSIS — G8929 Other chronic pain: Secondary | ICD-10-CM | POA: Diagnosis not present

## 2022-08-07 DIAGNOSIS — Z7902 Long term (current) use of antithrombotics/antiplatelets: Secondary | ICD-10-CM | POA: Diagnosis not present

## 2022-08-07 DIAGNOSIS — M159 Polyosteoarthritis, unspecified: Secondary | ICD-10-CM | POA: Diagnosis not present

## 2022-08-07 DIAGNOSIS — M109 Gout, unspecified: Secondary | ICD-10-CM | POA: Diagnosis not present

## 2022-08-12 DIAGNOSIS — Z85828 Personal history of other malignant neoplasm of skin: Secondary | ICD-10-CM | POA: Diagnosis not present

## 2022-08-12 DIAGNOSIS — M25512 Pain in left shoulder: Secondary | ICD-10-CM | POA: Diagnosis not present

## 2022-08-12 DIAGNOSIS — I10 Essential (primary) hypertension: Secondary | ICD-10-CM | POA: Diagnosis not present

## 2022-08-12 DIAGNOSIS — M542 Cervicalgia: Secondary | ICD-10-CM | POA: Diagnosis not present

## 2022-08-12 DIAGNOSIS — E7800 Pure hypercholesterolemia, unspecified: Secondary | ICD-10-CM | POA: Diagnosis not present

## 2022-08-12 DIAGNOSIS — H9193 Unspecified hearing loss, bilateral: Secondary | ICD-10-CM | POA: Diagnosis not present

## 2022-08-12 DIAGNOSIS — M7022 Olecranon bursitis, left elbow: Secondary | ICD-10-CM | POA: Diagnosis not present

## 2022-08-12 DIAGNOSIS — H34239 Retinal artery branch occlusion, unspecified eye: Secondary | ICD-10-CM | POA: Diagnosis not present

## 2022-08-12 DIAGNOSIS — Z79899 Other long term (current) drug therapy: Secondary | ICD-10-CM | POA: Diagnosis not present

## 2022-08-12 DIAGNOSIS — R413 Other amnesia: Secondary | ICD-10-CM | POA: Diagnosis not present

## 2022-08-12 DIAGNOSIS — I6523 Occlusion and stenosis of bilateral carotid arteries: Secondary | ICD-10-CM | POA: Diagnosis not present

## 2022-08-12 DIAGNOSIS — I2510 Atherosclerotic heart disease of native coronary artery without angina pectoris: Secondary | ICD-10-CM | POA: Diagnosis not present

## 2022-08-12 DIAGNOSIS — Z7902 Long term (current) use of antithrombotics/antiplatelets: Secondary | ICD-10-CM | POA: Diagnosis not present

## 2022-08-12 DIAGNOSIS — M109 Gout, unspecified: Secondary | ICD-10-CM | POA: Diagnosis not present

## 2022-08-12 DIAGNOSIS — G8929 Other chronic pain: Secondary | ICD-10-CM | POA: Diagnosis not present

## 2022-08-12 DIAGNOSIS — M159 Polyosteoarthritis, unspecified: Secondary | ICD-10-CM | POA: Diagnosis not present

## 2022-08-14 DIAGNOSIS — Z7902 Long term (current) use of antithrombotics/antiplatelets: Secondary | ICD-10-CM | POA: Diagnosis not present

## 2022-08-14 DIAGNOSIS — I6523 Occlusion and stenosis of bilateral carotid arteries: Secondary | ICD-10-CM | POA: Diagnosis not present

## 2022-08-14 DIAGNOSIS — H9193 Unspecified hearing loss, bilateral: Secondary | ICD-10-CM | POA: Diagnosis not present

## 2022-08-14 DIAGNOSIS — Z79899 Other long term (current) drug therapy: Secondary | ICD-10-CM | POA: Diagnosis not present

## 2022-08-14 DIAGNOSIS — M542 Cervicalgia: Secondary | ICD-10-CM | POA: Diagnosis not present

## 2022-08-14 DIAGNOSIS — I2510 Atherosclerotic heart disease of native coronary artery without angina pectoris: Secondary | ICD-10-CM | POA: Diagnosis not present

## 2022-08-14 DIAGNOSIS — E7800 Pure hypercholesterolemia, unspecified: Secondary | ICD-10-CM | POA: Diagnosis not present

## 2022-08-14 DIAGNOSIS — Z85828 Personal history of other malignant neoplasm of skin: Secondary | ICD-10-CM | POA: Diagnosis not present

## 2022-08-14 DIAGNOSIS — I10 Essential (primary) hypertension: Secondary | ICD-10-CM | POA: Diagnosis not present

## 2022-08-14 DIAGNOSIS — G8929 Other chronic pain: Secondary | ICD-10-CM | POA: Diagnosis not present

## 2022-08-14 DIAGNOSIS — M7022 Olecranon bursitis, left elbow: Secondary | ICD-10-CM | POA: Diagnosis not present

## 2022-08-14 DIAGNOSIS — M159 Polyosteoarthritis, unspecified: Secondary | ICD-10-CM | POA: Diagnosis not present

## 2022-08-14 DIAGNOSIS — H34239 Retinal artery branch occlusion, unspecified eye: Secondary | ICD-10-CM | POA: Diagnosis not present

## 2022-08-14 DIAGNOSIS — M109 Gout, unspecified: Secondary | ICD-10-CM | POA: Diagnosis not present

## 2022-08-14 DIAGNOSIS — M25512 Pain in left shoulder: Secondary | ICD-10-CM | POA: Diagnosis not present

## 2022-08-14 DIAGNOSIS — R413 Other amnesia: Secondary | ICD-10-CM | POA: Diagnosis not present

## 2022-08-17 DIAGNOSIS — M25512 Pain in left shoulder: Secondary | ICD-10-CM | POA: Diagnosis not present

## 2022-08-17 DIAGNOSIS — Z85828 Personal history of other malignant neoplasm of skin: Secondary | ICD-10-CM | POA: Diagnosis not present

## 2022-08-17 DIAGNOSIS — Z79899 Other long term (current) drug therapy: Secondary | ICD-10-CM | POA: Diagnosis not present

## 2022-08-17 DIAGNOSIS — I10 Essential (primary) hypertension: Secondary | ICD-10-CM | POA: Diagnosis not present

## 2022-08-17 DIAGNOSIS — M7022 Olecranon bursitis, left elbow: Secondary | ICD-10-CM | POA: Diagnosis not present

## 2022-08-17 DIAGNOSIS — M542 Cervicalgia: Secondary | ICD-10-CM | POA: Diagnosis not present

## 2022-08-17 DIAGNOSIS — G8929 Other chronic pain: Secondary | ICD-10-CM | POA: Diagnosis not present

## 2022-08-17 DIAGNOSIS — M159 Polyosteoarthritis, unspecified: Secondary | ICD-10-CM | POA: Diagnosis not present

## 2022-08-17 DIAGNOSIS — M109 Gout, unspecified: Secondary | ICD-10-CM | POA: Diagnosis not present

## 2022-08-17 DIAGNOSIS — E7800 Pure hypercholesterolemia, unspecified: Secondary | ICD-10-CM | POA: Diagnosis not present

## 2022-08-17 DIAGNOSIS — I6523 Occlusion and stenosis of bilateral carotid arteries: Secondary | ICD-10-CM | POA: Diagnosis not present

## 2022-08-17 DIAGNOSIS — R413 Other amnesia: Secondary | ICD-10-CM | POA: Diagnosis not present

## 2022-08-17 DIAGNOSIS — H34239 Retinal artery branch occlusion, unspecified eye: Secondary | ICD-10-CM | POA: Diagnosis not present

## 2022-08-17 DIAGNOSIS — H9193 Unspecified hearing loss, bilateral: Secondary | ICD-10-CM | POA: Diagnosis not present

## 2022-08-17 DIAGNOSIS — I2510 Atherosclerotic heart disease of native coronary artery without angina pectoris: Secondary | ICD-10-CM | POA: Diagnosis not present

## 2022-08-17 DIAGNOSIS — Z7902 Long term (current) use of antithrombotics/antiplatelets: Secondary | ICD-10-CM | POA: Diagnosis not present

## 2022-08-20 DIAGNOSIS — R413 Other amnesia: Secondary | ICD-10-CM | POA: Diagnosis not present

## 2022-08-20 DIAGNOSIS — M25512 Pain in left shoulder: Secondary | ICD-10-CM | POA: Diagnosis not present

## 2022-08-20 DIAGNOSIS — M542 Cervicalgia: Secondary | ICD-10-CM | POA: Diagnosis not present

## 2022-08-20 DIAGNOSIS — M109 Gout, unspecified: Secondary | ICD-10-CM | POA: Diagnosis not present

## 2022-08-20 DIAGNOSIS — I10 Essential (primary) hypertension: Secondary | ICD-10-CM | POA: Diagnosis not present

## 2022-08-20 DIAGNOSIS — H9193 Unspecified hearing loss, bilateral: Secondary | ICD-10-CM | POA: Diagnosis not present

## 2022-08-20 DIAGNOSIS — M7022 Olecranon bursitis, left elbow: Secondary | ICD-10-CM | POA: Diagnosis not present

## 2022-08-20 DIAGNOSIS — I2510 Atherosclerotic heart disease of native coronary artery without angina pectoris: Secondary | ICD-10-CM | POA: Diagnosis not present

## 2022-08-20 DIAGNOSIS — G8929 Other chronic pain: Secondary | ICD-10-CM | POA: Diagnosis not present

## 2022-08-20 DIAGNOSIS — H34239 Retinal artery branch occlusion, unspecified eye: Secondary | ICD-10-CM | POA: Diagnosis not present

## 2022-08-20 DIAGNOSIS — Z85828 Personal history of other malignant neoplasm of skin: Secondary | ICD-10-CM | POA: Diagnosis not present

## 2022-08-20 DIAGNOSIS — M159 Polyosteoarthritis, unspecified: Secondary | ICD-10-CM | POA: Diagnosis not present

## 2022-08-20 DIAGNOSIS — Z79899 Other long term (current) drug therapy: Secondary | ICD-10-CM | POA: Diagnosis not present

## 2022-08-20 DIAGNOSIS — I6523 Occlusion and stenosis of bilateral carotid arteries: Secondary | ICD-10-CM | POA: Diagnosis not present

## 2022-08-20 DIAGNOSIS — E7800 Pure hypercholesterolemia, unspecified: Secondary | ICD-10-CM | POA: Diagnosis not present

## 2022-08-20 DIAGNOSIS — Z7902 Long term (current) use of antithrombotics/antiplatelets: Secondary | ICD-10-CM | POA: Diagnosis not present

## 2022-08-28 DIAGNOSIS — M7022 Olecranon bursitis, left elbow: Secondary | ICD-10-CM | POA: Diagnosis not present

## 2022-08-28 DIAGNOSIS — M25512 Pain in left shoulder: Secondary | ICD-10-CM | POA: Diagnosis not present

## 2022-08-28 DIAGNOSIS — I6523 Occlusion and stenosis of bilateral carotid arteries: Secondary | ICD-10-CM | POA: Diagnosis not present

## 2022-08-28 DIAGNOSIS — M109 Gout, unspecified: Secondary | ICD-10-CM | POA: Diagnosis not present

## 2022-08-28 DIAGNOSIS — R413 Other amnesia: Secondary | ICD-10-CM | POA: Diagnosis not present

## 2022-08-28 DIAGNOSIS — H34239 Retinal artery branch occlusion, unspecified eye: Secondary | ICD-10-CM | POA: Diagnosis not present

## 2022-08-28 DIAGNOSIS — Z79899 Other long term (current) drug therapy: Secondary | ICD-10-CM | POA: Diagnosis not present

## 2022-08-28 DIAGNOSIS — G8929 Other chronic pain: Secondary | ICD-10-CM | POA: Diagnosis not present

## 2022-08-28 DIAGNOSIS — Z85828 Personal history of other malignant neoplasm of skin: Secondary | ICD-10-CM | POA: Diagnosis not present

## 2022-08-28 DIAGNOSIS — H9193 Unspecified hearing loss, bilateral: Secondary | ICD-10-CM | POA: Diagnosis not present

## 2022-08-28 DIAGNOSIS — Z7902 Long term (current) use of antithrombotics/antiplatelets: Secondary | ICD-10-CM | POA: Diagnosis not present

## 2022-08-28 DIAGNOSIS — E7800 Pure hypercholesterolemia, unspecified: Secondary | ICD-10-CM | POA: Diagnosis not present

## 2022-08-28 DIAGNOSIS — I2510 Atherosclerotic heart disease of native coronary artery without angina pectoris: Secondary | ICD-10-CM | POA: Diagnosis not present

## 2022-08-28 DIAGNOSIS — M542 Cervicalgia: Secondary | ICD-10-CM | POA: Diagnosis not present

## 2022-08-28 DIAGNOSIS — M159 Polyosteoarthritis, unspecified: Secondary | ICD-10-CM | POA: Diagnosis not present

## 2022-08-28 DIAGNOSIS — I10 Essential (primary) hypertension: Secondary | ICD-10-CM | POA: Diagnosis not present

## 2022-09-04 DIAGNOSIS — E7800 Pure hypercholesterolemia, unspecified: Secondary | ICD-10-CM | POA: Diagnosis not present

## 2022-09-04 DIAGNOSIS — I10 Essential (primary) hypertension: Secondary | ICD-10-CM | POA: Diagnosis not present

## 2022-09-04 DIAGNOSIS — Z85828 Personal history of other malignant neoplasm of skin: Secondary | ICD-10-CM | POA: Diagnosis not present

## 2022-09-04 DIAGNOSIS — M25512 Pain in left shoulder: Secondary | ICD-10-CM | POA: Diagnosis not present

## 2022-09-04 DIAGNOSIS — R413 Other amnesia: Secondary | ICD-10-CM | POA: Diagnosis not present

## 2022-09-04 DIAGNOSIS — Z79899 Other long term (current) drug therapy: Secondary | ICD-10-CM | POA: Diagnosis not present

## 2022-09-04 DIAGNOSIS — G8929 Other chronic pain: Secondary | ICD-10-CM | POA: Diagnosis not present

## 2022-09-04 DIAGNOSIS — M109 Gout, unspecified: Secondary | ICD-10-CM | POA: Diagnosis not present

## 2022-09-04 DIAGNOSIS — M159 Polyosteoarthritis, unspecified: Secondary | ICD-10-CM | POA: Diagnosis not present

## 2022-09-04 DIAGNOSIS — I2510 Atherosclerotic heart disease of native coronary artery without angina pectoris: Secondary | ICD-10-CM | POA: Diagnosis not present

## 2022-09-04 DIAGNOSIS — M542 Cervicalgia: Secondary | ICD-10-CM | POA: Diagnosis not present

## 2022-09-04 DIAGNOSIS — I6523 Occlusion and stenosis of bilateral carotid arteries: Secondary | ICD-10-CM | POA: Diagnosis not present

## 2022-09-04 DIAGNOSIS — H9193 Unspecified hearing loss, bilateral: Secondary | ICD-10-CM | POA: Diagnosis not present

## 2022-09-04 DIAGNOSIS — H34239 Retinal artery branch occlusion, unspecified eye: Secondary | ICD-10-CM | POA: Diagnosis not present

## 2022-09-04 DIAGNOSIS — Z7902 Long term (current) use of antithrombotics/antiplatelets: Secondary | ICD-10-CM | POA: Diagnosis not present

## 2022-09-04 DIAGNOSIS — M7022 Olecranon bursitis, left elbow: Secondary | ICD-10-CM | POA: Diagnosis not present

## 2022-09-10 DIAGNOSIS — E785 Hyperlipidemia, unspecified: Secondary | ICD-10-CM | POA: Diagnosis not present

## 2022-09-10 DIAGNOSIS — Z125 Encounter for screening for malignant neoplasm of prostate: Secondary | ICD-10-CM | POA: Diagnosis not present

## 2022-09-10 DIAGNOSIS — M109 Gout, unspecified: Secondary | ICD-10-CM | POA: Diagnosis not present

## 2022-09-13 DIAGNOSIS — M542 Cervicalgia: Secondary | ICD-10-CM | POA: Diagnosis not present

## 2022-09-13 DIAGNOSIS — H9193 Unspecified hearing loss, bilateral: Secondary | ICD-10-CM | POA: Diagnosis not present

## 2022-09-13 DIAGNOSIS — M159 Polyosteoarthritis, unspecified: Secondary | ICD-10-CM | POA: Diagnosis not present

## 2022-09-13 DIAGNOSIS — Z85828 Personal history of other malignant neoplasm of skin: Secondary | ICD-10-CM | POA: Diagnosis not present

## 2022-09-13 DIAGNOSIS — E7800 Pure hypercholesterolemia, unspecified: Secondary | ICD-10-CM | POA: Diagnosis not present

## 2022-09-13 DIAGNOSIS — I10 Essential (primary) hypertension: Secondary | ICD-10-CM | POA: Diagnosis not present

## 2022-09-13 DIAGNOSIS — M109 Gout, unspecified: Secondary | ICD-10-CM | POA: Diagnosis not present

## 2022-09-13 DIAGNOSIS — I2510 Atherosclerotic heart disease of native coronary artery without angina pectoris: Secondary | ICD-10-CM | POA: Diagnosis not present

## 2022-09-13 DIAGNOSIS — Z7902 Long term (current) use of antithrombotics/antiplatelets: Secondary | ICD-10-CM | POA: Diagnosis not present

## 2022-09-13 DIAGNOSIS — H34239 Retinal artery branch occlusion, unspecified eye: Secondary | ICD-10-CM | POA: Diagnosis not present

## 2022-09-13 DIAGNOSIS — Z79899 Other long term (current) drug therapy: Secondary | ICD-10-CM | POA: Diagnosis not present

## 2022-09-13 DIAGNOSIS — M25512 Pain in left shoulder: Secondary | ICD-10-CM | POA: Diagnosis not present

## 2022-09-13 DIAGNOSIS — I6523 Occlusion and stenosis of bilateral carotid arteries: Secondary | ICD-10-CM | POA: Diagnosis not present

## 2022-09-13 DIAGNOSIS — R413 Other amnesia: Secondary | ICD-10-CM | POA: Diagnosis not present

## 2022-09-13 DIAGNOSIS — G8929 Other chronic pain: Secondary | ICD-10-CM | POA: Diagnosis not present

## 2022-09-13 DIAGNOSIS — M7022 Olecranon bursitis, left elbow: Secondary | ICD-10-CM | POA: Diagnosis not present

## 2022-09-17 DIAGNOSIS — R82998 Other abnormal findings in urine: Secondary | ICD-10-CM | POA: Diagnosis not present

## 2022-09-17 DIAGNOSIS — I10 Essential (primary) hypertension: Secondary | ICD-10-CM | POA: Diagnosis not present

## 2022-12-18 ENCOUNTER — Ambulatory Visit: Payer: PPO | Admitting: Cardiology

## 2023-01-11 NOTE — Progress Notes (Unsigned)
Cardiology Office Note:  .   Date:  01/13/2023  ID:  Craig [name], DOB Mar 26, 1931, MRN [id number] PCP: Garlan Fillers, MD  [facility] Providers Cardiologist:  Yates Decamp, MD   History of Present Illness: .   [name] is a 89 y.o. Caucasian male with hypertension, hyperlipidemia, retinal artery branch occlusion in December 2013 found to have moderate bilateral carotid artery stenosis, managed medically, hyperlipidemia presents here for 3 month follow-up of carotid stenosis, hyperlipidemia.  Since being on Plavix, he has not had any recurrence of TIA-like symptoms and although has high-grade/occlusive disease in the left carotid, given his advanced age and cognitive disorder, medical therapy recommended.  This is a 70-month office visit.   Discussed the use of AI scribe software for clinical note transcription with the patient, who gave verbal consent to proceed.  History of Present Illness   Male patient with memory issues, presents for a routine check-up. He reports difficulty with his memory, stating that his "brain doesn't work." Despite this, he continues to work, albeit at a reduced capacity. 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 Right shoulder area cyst that was removed by a surgeon, which has not recurred.   He is on a low dose of cholesterol medication. Craig [name] is physically active, walking and moving around regularly. He has a DNR order in place.   Review of Systems  Cardiovascular:  Negative for chest pain, dyspnea on exertion and leg swelling.    Labs   External Labs:  NA  Physical Exam:   VS:  BP 126/62 (BP Location: Left Arm, Patient Position: Sitting, Cuff Size: Normal)   Pulse 72   Resp 16   Ht 5\' 7"  (1.702 m)   Wt 153 lb 6.4 oz (69.6 kg)   SpO2 98%   BMI 24.03 kg/m    Wt Readings from Last 3 Encounters:  01/13/23 153 lb 6.4 oz (69.6 kg)  06/18/22 156 lb (70.8 kg)  12/18/21 156 lb 6.4 oz  (70.9 kg)     Physical Exam Neck:     Vascular: Carotid bruit (right) present. No JVD.  Cardiovascular:     Rate and Rhythm: Normal rate and regular rhythm.     Pulses: Intact distal pulses.          Dorsalis pedis pulses are 1+ on the right side and 1+ on the left side.       Posterior tibial pulses are 1+ on the right side and 1+ on the left side.     Heart sounds: Murmur heard.     Midsystolic murmur is present with a grade of 2/6 at the upper right sternal border.     No gallop.  Pulmonary:     Effort: Pulmonary effort is normal.     Breath sounds: Normal breath sounds.  Abdominal:     General: Bowel sounds are normal.     Palpations: Abdomen is soft.  Musculoskeletal:     Right lower leg: No edema.     Left lower leg: No edema.     Studies Reviewed: .    Carotid artery duplex 12/12/2021: Duplex suggests stenosis in the right internal carotid artery (50-69%). No evidence of significant stenosis in the right external carotid artery. Duplex suggests stenosis in the left internal carotid artery (near occlusion). No evidence of significant stenosis in the left external carotid artery. Antegrade right vertebral artery flow. Antegrade left vertebral artery flow. NO  significant change since 06/20/2021. Left ICA is known occluded vessel. Follow up in four months is appropriate if clinically indicated.   EKG:    EKG Interpretation Date/Time:  Monday January 13 2023 11:37:22 EST Ventricular Rate:  61 PR Interval:  206 QRS Duration:  82 QT Interval:  400 QTC Calculation: 402 R Axis:   66  Text Interpretation: EKG 01/13/2023: Normal sinus rhythm at rate of 61 bpm, normal EKG.  No significant change from 02/08/2021. Confirmed by Delrae Rend [phone]) on 01/13/2023 11:52:54 AM    EKG 06/18/2022: Normal sinus rhythm at the rate of 69 bpm, poor R progression, probably normal variant. No evidence of ischemia. Compared to 02/16/2021, no significant change   Medications and  allergies    Allergies  Allergen Reactions   Vioxx [Rofecoxib] Rash    Current Meds  Medication Sig   allopurinol (ZYLOPRIM) 300 MG tablet Take 300 mg by mouth daily.   Carboxymeth-Glycerin-Polysorb 0.5-1-0.5 % SOLN Place 2 drops into both eyes in the morning and at bedtime.   clopidogrel (PLAVIX) 75 MG tablet Take 1 tablet (75 mg total) by mouth daily.   NON FORMULARY Take 6.25 mg by mouth in the morning and at bedtime. CBD Delta 8 Gummies   NON FORMULARY Apply 1 application  topically as needed (pain). CBD Cream   NON FORMULARY Take 2 capsules by mouth daily. Dynamic brain supplement   rosuvastatin (CRESTOR) 10 MG tablet Take 10 mg by mouth daily.   vitamin B-12 (CYANOCOBALAMIN) 500 MCG tablet Take 500 mcg by mouth daily.     ASSESSMENT AND PLAN: .      ICD-10-CM   1. Bilateral carotid artery stenosis  I65.23 EKG 12-Lead    2. Left carotid artery occlusion  I65.22     3. Hypercholesteremia  E78.00     4. DNR (do not resuscitate)  Z66      1. Bilateral carotid artery stenosis Patient has occluded left carotid artery and mild stenosis right, however because of his advanced age and also dementia, no further evaluation is indicated.  He is presently on clopidogrel and also rosuvastatin, continue the same.  Fortunately no recurrence of TIA-like symptoms. - EKG 12-Lead  2. Left carotid artery occlusion As dictated above.  3. Hypercholesteremia Hypercholesterolemia is being managed by his PCP, is presently on rosuvastatin 10 mg daily.  Goal LDL <70.  4. DNR (do not resuscitate) Patient is accompanied by son-in-law, patient is DNR, does have ACP documents uploaded.  I will see him back on annual basis.  Signed,  Yates Decamp, MD, [facility] 01/13/2023, 11:56 AM [facility] [address] [place], [place] [address] Phone: [phone]. Fax:  [phone]

## 2023-01-13 ENCOUNTER — Ambulatory Visit: Payer: PPO | Attending: Cardiology | Admitting: Cardiology

## 2023-01-13 ENCOUNTER — Encounter: Payer: Self-pay | Admitting: Cardiology

## 2023-01-13 VITALS — BP 126/62 | HR 72 | Resp 16 | Ht 67.0 in | Wt 153.4 lb

## 2023-01-13 DIAGNOSIS — I6523 Occlusion and stenosis of bilateral carotid arteries: Secondary | ICD-10-CM | POA: Diagnosis not present

## 2023-01-13 DIAGNOSIS — Z66 Do not resuscitate: Secondary | ICD-10-CM | POA: Diagnosis not present

## 2023-01-13 DIAGNOSIS — I6522 Occlusion and stenosis of left carotid artery: Secondary | ICD-10-CM

## 2023-01-13 DIAGNOSIS — E7800 Pure hypercholesterolemia, unspecified: Secondary | ICD-10-CM

## 2023-01-13 NOTE — Patient Instructions (Signed)

## 2023-02-22 ENCOUNTER — Other Ambulatory Visit: Payer: Self-pay | Admitting: Cardiology

## 2023-03-10 DIAGNOSIS — E785 Hyperlipidemia, unspecified: Secondary | ICD-10-CM | POA: Diagnosis not present

## 2023-03-10 DIAGNOSIS — M159 Polyosteoarthritis, unspecified: Secondary | ICD-10-CM | POA: Diagnosis not present

## 2023-03-10 DIAGNOSIS — R413 Other amnesia: Secondary | ICD-10-CM | POA: Diagnosis not present

## 2023-03-10 DIAGNOSIS — I6523 Occlusion and stenosis of bilateral carotid arteries: Secondary | ICD-10-CM | POA: Diagnosis not present

## 2023-03-10 DIAGNOSIS — I10 Essential (primary) hypertension: Secondary | ICD-10-CM | POA: Diagnosis not present

## 2023-03-10 DIAGNOSIS — H6123 Impacted cerumen, bilateral: Secondary | ICD-10-CM | POA: Diagnosis not present

## 2023-03-10 DIAGNOSIS — R2681 Unsteadiness on feet: Secondary | ICD-10-CM | POA: Diagnosis not present

## 2023-04-15 DIAGNOSIS — H47011 Ischemic optic neuropathy, right eye: Secondary | ICD-10-CM | POA: Diagnosis not present

## 2023-04-15 DIAGNOSIS — Z961 Presence of intraocular lens: Secondary | ICD-10-CM | POA: Diagnosis not present

## 2023-05-16 DIAGNOSIS — Z111 Encounter for screening for respiratory tuberculosis: Secondary | ICD-10-CM | POA: Diagnosis not present

## 2023-05-26 DIAGNOSIS — F331 Major depressive disorder, recurrent, moderate: Secondary | ICD-10-CM | POA: Diagnosis not present

## 2023-05-26 DIAGNOSIS — F411 Generalized anxiety disorder: Secondary | ICD-10-CM | POA: Diagnosis not present

## 2023-05-26 DIAGNOSIS — G3184 Mild cognitive impairment, so stated: Secondary | ICD-10-CM | POA: Diagnosis not present

## 2023-05-26 DIAGNOSIS — G459 Transient cerebral ischemic attack, unspecified: Secondary | ICD-10-CM | POA: Diagnosis not present

## 2023-06-06 DIAGNOSIS — F0390 Unspecified dementia without behavioral disturbance: Secondary | ICD-10-CM | POA: Diagnosis not present

## 2023-06-06 DIAGNOSIS — F419 Anxiety disorder, unspecified: Secondary | ICD-10-CM | POA: Diagnosis not present

## 2023-06-09 DIAGNOSIS — F411 Generalized anxiety disorder: Secondary | ICD-10-CM | POA: Diagnosis not present

## 2023-06-09 DIAGNOSIS — F331 Major depressive disorder, recurrent, moderate: Secondary | ICD-10-CM | POA: Diagnosis not present

## 2023-06-09 DIAGNOSIS — F0150 Vascular dementia without behavioral disturbance: Secondary | ICD-10-CM | POA: Diagnosis not present

## 2023-06-11 DIAGNOSIS — R2689 Other abnormalities of gait and mobility: Secondary | ICD-10-CM | POA: Diagnosis not present

## 2023-06-11 DIAGNOSIS — R488 Other symbolic dysfunctions: Secondary | ICD-10-CM | POA: Diagnosis not present

## 2023-06-11 DIAGNOSIS — R41841 Cognitive communication deficit: Secondary | ICD-10-CM | POA: Diagnosis not present

## 2023-06-11 DIAGNOSIS — R4185 Anosognosia: Secondary | ICD-10-CM | POA: Diagnosis not present

## 2023-06-11 DIAGNOSIS — R2681 Unsteadiness on feet: Secondary | ICD-10-CM | POA: Diagnosis not present

## 2023-06-11 DIAGNOSIS — M158 Other polyosteoarthritis: Secondary | ICD-10-CM | POA: Diagnosis not present

## 2023-06-11 DIAGNOSIS — R278 Other lack of coordination: Secondary | ICD-10-CM | POA: Diagnosis not present

## 2023-06-11 DIAGNOSIS — R4789 Other speech disturbances: Secondary | ICD-10-CM | POA: Diagnosis not present

## 2023-06-13 DIAGNOSIS — R4789 Other speech disturbances: Secondary | ICD-10-CM | POA: Diagnosis not present

## 2023-06-13 DIAGNOSIS — R488 Other symbolic dysfunctions: Secondary | ICD-10-CM | POA: Diagnosis not present

## 2023-06-13 DIAGNOSIS — R4185 Anosognosia: Secondary | ICD-10-CM | POA: Diagnosis not present

## 2023-06-13 DIAGNOSIS — M158 Other polyosteoarthritis: Secondary | ICD-10-CM | POA: Diagnosis not present

## 2023-06-13 DIAGNOSIS — R2689 Other abnormalities of gait and mobility: Secondary | ICD-10-CM | POA: Diagnosis not present

## 2023-06-13 DIAGNOSIS — R41841 Cognitive communication deficit: Secondary | ICD-10-CM | POA: Diagnosis not present

## 2023-06-13 DIAGNOSIS — R2681 Unsteadiness on feet: Secondary | ICD-10-CM | POA: Diagnosis not present

## 2023-06-13 DIAGNOSIS — R278 Other lack of coordination: Secondary | ICD-10-CM | POA: Diagnosis not present

## 2023-06-16 DIAGNOSIS — R2681 Unsteadiness on feet: Secondary | ICD-10-CM | POA: Diagnosis not present

## 2023-06-16 DIAGNOSIS — R2689 Other abnormalities of gait and mobility: Secondary | ICD-10-CM | POA: Diagnosis not present

## 2023-06-16 DIAGNOSIS — R4789 Other speech disturbances: Secondary | ICD-10-CM | POA: Diagnosis not present

## 2023-06-16 DIAGNOSIS — R41841 Cognitive communication deficit: Secondary | ICD-10-CM | POA: Diagnosis not present

## 2023-06-16 DIAGNOSIS — R278 Other lack of coordination: Secondary | ICD-10-CM | POA: Diagnosis not present

## 2023-06-16 DIAGNOSIS — R4185 Anosognosia: Secondary | ICD-10-CM | POA: Diagnosis not present

## 2023-06-16 DIAGNOSIS — R488 Other symbolic dysfunctions: Secondary | ICD-10-CM | POA: Diagnosis not present

## 2023-06-16 DIAGNOSIS — M158 Other polyosteoarthritis: Secondary | ICD-10-CM | POA: Diagnosis not present

## 2023-06-17 DIAGNOSIS — R278 Other lack of coordination: Secondary | ICD-10-CM | POA: Diagnosis not present

## 2023-06-17 DIAGNOSIS — R4185 Anosognosia: Secondary | ICD-10-CM | POA: Diagnosis not present

## 2023-06-17 DIAGNOSIS — R488 Other symbolic dysfunctions: Secondary | ICD-10-CM | POA: Diagnosis not present

## 2023-06-17 DIAGNOSIS — R2689 Other abnormalities of gait and mobility: Secondary | ICD-10-CM | POA: Diagnosis not present

## 2023-06-17 DIAGNOSIS — R4789 Other speech disturbances: Secondary | ICD-10-CM | POA: Diagnosis not present

## 2023-06-17 DIAGNOSIS — R41841 Cognitive communication deficit: Secondary | ICD-10-CM | POA: Diagnosis not present

## 2023-06-17 DIAGNOSIS — M158 Other polyosteoarthritis: Secondary | ICD-10-CM | POA: Diagnosis not present

## 2023-06-17 DIAGNOSIS — R2681 Unsteadiness on feet: Secondary | ICD-10-CM | POA: Diagnosis not present

## 2023-06-18 DIAGNOSIS — R4789 Other speech disturbances: Secondary | ICD-10-CM | POA: Diagnosis not present

## 2023-06-18 DIAGNOSIS — R4185 Anosognosia: Secondary | ICD-10-CM | POA: Diagnosis not present

## 2023-06-18 DIAGNOSIS — R2689 Other abnormalities of gait and mobility: Secondary | ICD-10-CM | POA: Diagnosis not present

## 2023-06-18 DIAGNOSIS — R2681 Unsteadiness on feet: Secondary | ICD-10-CM | POA: Diagnosis not present

## 2023-06-18 DIAGNOSIS — R278 Other lack of coordination: Secondary | ICD-10-CM | POA: Diagnosis not present

## 2023-06-18 DIAGNOSIS — M158 Other polyosteoarthritis: Secondary | ICD-10-CM | POA: Diagnosis not present

## 2023-06-18 DIAGNOSIS — R488 Other symbolic dysfunctions: Secondary | ICD-10-CM | POA: Diagnosis not present

## 2023-06-18 DIAGNOSIS — R41841 Cognitive communication deficit: Secondary | ICD-10-CM | POA: Diagnosis not present

## 2023-06-19 DIAGNOSIS — R488 Other symbolic dysfunctions: Secondary | ICD-10-CM | POA: Diagnosis not present

## 2023-06-19 DIAGNOSIS — R41841 Cognitive communication deficit: Secondary | ICD-10-CM | POA: Diagnosis not present

## 2023-06-19 DIAGNOSIS — R4185 Anosognosia: Secondary | ICD-10-CM | POA: Diagnosis not present

## 2023-06-19 DIAGNOSIS — R4789 Other speech disturbances: Secondary | ICD-10-CM | POA: Diagnosis not present

## 2023-06-19 DIAGNOSIS — R2681 Unsteadiness on feet: Secondary | ICD-10-CM | POA: Diagnosis not present

## 2023-06-19 DIAGNOSIS — R278 Other lack of coordination: Secondary | ICD-10-CM | POA: Diagnosis not present

## 2023-06-23 DIAGNOSIS — F331 Major depressive disorder, recurrent, moderate: Secondary | ICD-10-CM | POA: Diagnosis not present

## 2023-06-23 DIAGNOSIS — F411 Generalized anxiety disorder: Secondary | ICD-10-CM | POA: Diagnosis not present

## 2023-06-23 DIAGNOSIS — R41841 Cognitive communication deficit: Secondary | ICD-10-CM | POA: Diagnosis not present

## 2023-06-23 DIAGNOSIS — R2681 Unsteadiness on feet: Secondary | ICD-10-CM | POA: Diagnosis not present

## 2023-06-23 DIAGNOSIS — R278 Other lack of coordination: Secondary | ICD-10-CM | POA: Diagnosis not present

## 2023-06-23 DIAGNOSIS — F0150 Vascular dementia without behavioral disturbance: Secondary | ICD-10-CM | POA: Diagnosis not present

## 2023-06-23 DIAGNOSIS — R488 Other symbolic dysfunctions: Secondary | ICD-10-CM | POA: Diagnosis not present

## 2023-06-23 DIAGNOSIS — R4789 Other speech disturbances: Secondary | ICD-10-CM | POA: Diagnosis not present

## 2023-06-23 DIAGNOSIS — R4185 Anosognosia: Secondary | ICD-10-CM | POA: Diagnosis not present

## 2023-06-24 DIAGNOSIS — R4789 Other speech disturbances: Secondary | ICD-10-CM | POA: Diagnosis not present

## 2023-06-24 DIAGNOSIS — R278 Other lack of coordination: Secondary | ICD-10-CM | POA: Diagnosis not present

## 2023-06-24 DIAGNOSIS — R488 Other symbolic dysfunctions: Secondary | ICD-10-CM | POA: Diagnosis not present

## 2023-06-24 DIAGNOSIS — R41841 Cognitive communication deficit: Secondary | ICD-10-CM | POA: Diagnosis not present

## 2023-06-24 DIAGNOSIS — R4185 Anosognosia: Secondary | ICD-10-CM | POA: Diagnosis not present

## 2023-06-24 DIAGNOSIS — R2681 Unsteadiness on feet: Secondary | ICD-10-CM | POA: Diagnosis not present

## 2023-06-25 DIAGNOSIS — R488 Other symbolic dysfunctions: Secondary | ICD-10-CM | POA: Diagnosis not present

## 2023-06-25 DIAGNOSIS — R4789 Other speech disturbances: Secondary | ICD-10-CM | POA: Diagnosis not present

## 2023-06-25 DIAGNOSIS — R278 Other lack of coordination: Secondary | ICD-10-CM | POA: Diagnosis not present

## 2023-06-25 DIAGNOSIS — R2681 Unsteadiness on feet: Secondary | ICD-10-CM | POA: Diagnosis not present

## 2023-06-25 DIAGNOSIS — R41841 Cognitive communication deficit: Secondary | ICD-10-CM | POA: Diagnosis not present

## 2023-06-25 DIAGNOSIS — R4185 Anosognosia: Secondary | ICD-10-CM | POA: Diagnosis not present

## 2023-06-26 DIAGNOSIS — F331 Major depressive disorder, recurrent, moderate: Secondary | ICD-10-CM | POA: Diagnosis not present

## 2023-06-26 DIAGNOSIS — F411 Generalized anxiety disorder: Secondary | ICD-10-CM | POA: Diagnosis not present

## 2023-06-26 DIAGNOSIS — Z8673 Personal history of transient ischemic attack (TIA), and cerebral infarction without residual deficits: Secondary | ICD-10-CM | POA: Diagnosis not present

## 2023-06-26 DIAGNOSIS — E7800 Pure hypercholesterolemia, unspecified: Secondary | ICD-10-CM | POA: Diagnosis not present

## 2023-06-26 DIAGNOSIS — M109 Gout, unspecified: Secondary | ICD-10-CM | POA: Diagnosis not present

## 2023-06-26 DIAGNOSIS — J309 Allergic rhinitis, unspecified: Secondary | ICD-10-CM | POA: Diagnosis not present

## 2023-06-26 DIAGNOSIS — F0280 Dementia in other diseases classified elsewhere without behavioral disturbance: Secondary | ICD-10-CM | POA: Diagnosis not present

## 2023-06-26 DIAGNOSIS — F0150 Vascular dementia without behavioral disturbance: Secondary | ICD-10-CM | POA: Diagnosis not present

## 2023-06-26 DIAGNOSIS — Z7982 Long term (current) use of aspirin: Secondary | ICD-10-CM | POA: Diagnosis not present

## 2023-06-26 DIAGNOSIS — Z7902 Long term (current) use of antithrombotics/antiplatelets: Secondary | ICD-10-CM | POA: Diagnosis not present

## 2023-06-27 DIAGNOSIS — R488 Other symbolic dysfunctions: Secondary | ICD-10-CM | POA: Diagnosis not present

## 2023-06-27 DIAGNOSIS — R41841 Cognitive communication deficit: Secondary | ICD-10-CM | POA: Diagnosis not present

## 2023-06-27 DIAGNOSIS — R2681 Unsteadiness on feet: Secondary | ICD-10-CM | POA: Diagnosis not present

## 2023-06-27 DIAGNOSIS — R4789 Other speech disturbances: Secondary | ICD-10-CM | POA: Diagnosis not present

## 2023-06-27 DIAGNOSIS — R278 Other lack of coordination: Secondary | ICD-10-CM | POA: Diagnosis not present

## 2023-06-27 DIAGNOSIS — R4185 Anosognosia: Secondary | ICD-10-CM | POA: Diagnosis not present

## 2023-07-01 DIAGNOSIS — R4789 Other speech disturbances: Secondary | ICD-10-CM | POA: Diagnosis not present

## 2023-07-01 DIAGNOSIS — R41841 Cognitive communication deficit: Secondary | ICD-10-CM | POA: Diagnosis not present

## 2023-07-01 DIAGNOSIS — R2681 Unsteadiness on feet: Secondary | ICD-10-CM | POA: Diagnosis not present

## 2023-07-01 DIAGNOSIS — R4185 Anosognosia: Secondary | ICD-10-CM | POA: Diagnosis not present

## 2023-07-01 DIAGNOSIS — R278 Other lack of coordination: Secondary | ICD-10-CM | POA: Diagnosis not present

## 2023-07-01 DIAGNOSIS — R488 Other symbolic dysfunctions: Secondary | ICD-10-CM | POA: Diagnosis not present

## 2023-07-03 DIAGNOSIS — R4185 Anosognosia: Secondary | ICD-10-CM | POA: Diagnosis not present

## 2023-07-03 DIAGNOSIS — R4789 Other speech disturbances: Secondary | ICD-10-CM | POA: Diagnosis not present

## 2023-07-03 DIAGNOSIS — R488 Other symbolic dysfunctions: Secondary | ICD-10-CM | POA: Diagnosis not present

## 2023-07-03 DIAGNOSIS — R41841 Cognitive communication deficit: Secondary | ICD-10-CM | POA: Diagnosis not present

## 2023-07-03 DIAGNOSIS — R278 Other lack of coordination: Secondary | ICD-10-CM | POA: Diagnosis not present

## 2023-07-03 DIAGNOSIS — R2681 Unsteadiness on feet: Secondary | ICD-10-CM | POA: Diagnosis not present

## 2023-07-04 DIAGNOSIS — R278 Other lack of coordination: Secondary | ICD-10-CM | POA: Diagnosis not present

## 2023-07-04 DIAGNOSIS — R4789 Other speech disturbances: Secondary | ICD-10-CM | POA: Diagnosis not present

## 2023-07-04 DIAGNOSIS — R4185 Anosognosia: Secondary | ICD-10-CM | POA: Diagnosis not present

## 2023-07-04 DIAGNOSIS — R488 Other symbolic dysfunctions: Secondary | ICD-10-CM | POA: Diagnosis not present

## 2023-07-04 DIAGNOSIS — R2681 Unsteadiness on feet: Secondary | ICD-10-CM | POA: Diagnosis not present

## 2023-07-04 DIAGNOSIS — R41841 Cognitive communication deficit: Secondary | ICD-10-CM | POA: Diagnosis not present

## 2023-07-07 DIAGNOSIS — Z8673 Personal history of transient ischemic attack (TIA), and cerebral infarction without residual deficits: Secondary | ICD-10-CM | POA: Diagnosis not present

## 2023-07-07 DIAGNOSIS — F331 Major depressive disorder, recurrent, moderate: Secondary | ICD-10-CM | POA: Diagnosis not present

## 2023-07-07 DIAGNOSIS — F411 Generalized anxiety disorder: Secondary | ICD-10-CM | POA: Diagnosis not present

## 2023-07-07 DIAGNOSIS — F0150 Vascular dementia without behavioral disturbance: Secondary | ICD-10-CM | POA: Diagnosis not present

## 2023-07-08 DIAGNOSIS — R2681 Unsteadiness on feet: Secondary | ICD-10-CM | POA: Diagnosis not present

## 2023-07-08 DIAGNOSIS — R488 Other symbolic dysfunctions: Secondary | ICD-10-CM | POA: Diagnosis not present

## 2023-07-08 DIAGNOSIS — R278 Other lack of coordination: Secondary | ICD-10-CM | POA: Diagnosis not present

## 2023-07-08 DIAGNOSIS — R4185 Anosognosia: Secondary | ICD-10-CM | POA: Diagnosis not present

## 2023-07-08 DIAGNOSIS — R41841 Cognitive communication deficit: Secondary | ICD-10-CM | POA: Diagnosis not present

## 2023-07-08 DIAGNOSIS — R4789 Other speech disturbances: Secondary | ICD-10-CM | POA: Diagnosis not present

## 2023-07-09 DIAGNOSIS — R41841 Cognitive communication deficit: Secondary | ICD-10-CM | POA: Diagnosis not present

## 2023-07-09 DIAGNOSIS — R2681 Unsteadiness on feet: Secondary | ICD-10-CM | POA: Diagnosis not present

## 2023-07-09 DIAGNOSIS — R278 Other lack of coordination: Secondary | ICD-10-CM | POA: Diagnosis not present

## 2023-07-09 DIAGNOSIS — R4185 Anosognosia: Secondary | ICD-10-CM | POA: Diagnosis not present

## 2023-07-09 DIAGNOSIS — R488 Other symbolic dysfunctions: Secondary | ICD-10-CM | POA: Diagnosis not present

## 2023-07-09 DIAGNOSIS — R4789 Other speech disturbances: Secondary | ICD-10-CM | POA: Diagnosis not present

## 2023-07-11 DIAGNOSIS — R488 Other symbolic dysfunctions: Secondary | ICD-10-CM | POA: Diagnosis not present

## 2023-07-11 DIAGNOSIS — R4789 Other speech disturbances: Secondary | ICD-10-CM | POA: Diagnosis not present

## 2023-07-11 DIAGNOSIS — R41841 Cognitive communication deficit: Secondary | ICD-10-CM | POA: Diagnosis not present

## 2023-07-11 DIAGNOSIS — R278 Other lack of coordination: Secondary | ICD-10-CM | POA: Diagnosis not present

## 2023-07-11 DIAGNOSIS — R2681 Unsteadiness on feet: Secondary | ICD-10-CM | POA: Diagnosis not present

## 2023-07-11 DIAGNOSIS — R4185 Anosognosia: Secondary | ICD-10-CM | POA: Diagnosis not present

## 2023-07-14 DIAGNOSIS — R488 Other symbolic dysfunctions: Secondary | ICD-10-CM | POA: Diagnosis not present

## 2023-07-14 DIAGNOSIS — R4185 Anosognosia: Secondary | ICD-10-CM | POA: Diagnosis not present

## 2023-07-14 DIAGNOSIS — R4789 Other speech disturbances: Secondary | ICD-10-CM | POA: Diagnosis not present

## 2023-07-14 DIAGNOSIS — R2681 Unsteadiness on feet: Secondary | ICD-10-CM | POA: Diagnosis not present

## 2023-07-14 DIAGNOSIS — R278 Other lack of coordination: Secondary | ICD-10-CM | POA: Diagnosis not present

## 2023-07-14 DIAGNOSIS — R41841 Cognitive communication deficit: Secondary | ICD-10-CM | POA: Diagnosis not present

## 2023-07-15 DIAGNOSIS — R4185 Anosognosia: Secondary | ICD-10-CM | POA: Diagnosis not present

## 2023-07-15 DIAGNOSIS — R278 Other lack of coordination: Secondary | ICD-10-CM | POA: Diagnosis not present

## 2023-07-15 DIAGNOSIS — R488 Other symbolic dysfunctions: Secondary | ICD-10-CM | POA: Diagnosis not present

## 2023-07-15 DIAGNOSIS — R2681 Unsteadiness on feet: Secondary | ICD-10-CM | POA: Diagnosis not present

## 2023-07-15 DIAGNOSIS — R41841 Cognitive communication deficit: Secondary | ICD-10-CM | POA: Diagnosis not present

## 2023-07-15 DIAGNOSIS — R4789 Other speech disturbances: Secondary | ICD-10-CM | POA: Diagnosis not present

## 2023-07-17 DIAGNOSIS — R41841 Cognitive communication deficit: Secondary | ICD-10-CM | POA: Diagnosis not present

## 2023-07-17 DIAGNOSIS — R278 Other lack of coordination: Secondary | ICD-10-CM | POA: Diagnosis not present

## 2023-07-17 DIAGNOSIS — R4789 Other speech disturbances: Secondary | ICD-10-CM | POA: Diagnosis not present

## 2023-07-17 DIAGNOSIS — R2681 Unsteadiness on feet: Secondary | ICD-10-CM | POA: Diagnosis not present

## 2023-07-17 DIAGNOSIS — R488 Other symbolic dysfunctions: Secondary | ICD-10-CM | POA: Diagnosis not present

## 2023-07-17 DIAGNOSIS — R4185 Anosognosia: Secondary | ICD-10-CM | POA: Diagnosis not present

## 2023-07-21 DIAGNOSIS — Z8673 Personal history of transient ischemic attack (TIA), and cerebral infarction without residual deficits: Secondary | ICD-10-CM | POA: Diagnosis not present

## 2023-07-21 DIAGNOSIS — F411 Generalized anxiety disorder: Secondary | ICD-10-CM | POA: Diagnosis not present

## 2023-07-21 DIAGNOSIS — F0150 Vascular dementia without behavioral disturbance: Secondary | ICD-10-CM | POA: Diagnosis not present

## 2023-07-21 DIAGNOSIS — F331 Major depressive disorder, recurrent, moderate: Secondary | ICD-10-CM | POA: Diagnosis not present

## 2023-07-22 DIAGNOSIS — R278 Other lack of coordination: Secondary | ICD-10-CM | POA: Diagnosis not present

## 2023-07-22 DIAGNOSIS — R41841 Cognitive communication deficit: Secondary | ICD-10-CM | POA: Diagnosis not present

## 2023-07-22 DIAGNOSIS — R488 Other symbolic dysfunctions: Secondary | ICD-10-CM | POA: Diagnosis not present

## 2023-07-22 DIAGNOSIS — R2681 Unsteadiness on feet: Secondary | ICD-10-CM | POA: Diagnosis not present

## 2023-07-22 DIAGNOSIS — R4185 Anosognosia: Secondary | ICD-10-CM | POA: Diagnosis not present

## 2023-07-22 DIAGNOSIS — R4789 Other speech disturbances: Secondary | ICD-10-CM | POA: Diagnosis not present

## 2023-07-24 DIAGNOSIS — R488 Other symbolic dysfunctions: Secondary | ICD-10-CM | POA: Diagnosis not present

## 2023-07-24 DIAGNOSIS — R41841 Cognitive communication deficit: Secondary | ICD-10-CM | POA: Diagnosis not present

## 2023-07-24 DIAGNOSIS — R2681 Unsteadiness on feet: Secondary | ICD-10-CM | POA: Diagnosis not present

## 2023-07-24 DIAGNOSIS — R4185 Anosognosia: Secondary | ICD-10-CM | POA: Diagnosis not present

## 2023-07-24 DIAGNOSIS — R278 Other lack of coordination: Secondary | ICD-10-CM | POA: Diagnosis not present

## 2023-07-24 DIAGNOSIS — R4789 Other speech disturbances: Secondary | ICD-10-CM | POA: Diagnosis not present

## 2023-07-25 DIAGNOSIS — R488 Other symbolic dysfunctions: Secondary | ICD-10-CM | POA: Diagnosis not present

## 2023-07-25 DIAGNOSIS — R4185 Anosognosia: Secondary | ICD-10-CM | POA: Diagnosis not present

## 2023-07-25 DIAGNOSIS — R41841 Cognitive communication deficit: Secondary | ICD-10-CM | POA: Diagnosis not present

## 2023-07-25 DIAGNOSIS — R4789 Other speech disturbances: Secondary | ICD-10-CM | POA: Diagnosis not present

## 2023-07-25 DIAGNOSIS — R278 Other lack of coordination: Secondary | ICD-10-CM | POA: Diagnosis not present

## 2023-07-25 DIAGNOSIS — R2681 Unsteadiness on feet: Secondary | ICD-10-CM | POA: Diagnosis not present

## 2023-07-29 DIAGNOSIS — R4185 Anosognosia: Secondary | ICD-10-CM | POA: Diagnosis not present

## 2023-07-29 DIAGNOSIS — R278 Other lack of coordination: Secondary | ICD-10-CM | POA: Diagnosis not present

## 2023-07-29 DIAGNOSIS — R41841 Cognitive communication deficit: Secondary | ICD-10-CM | POA: Diagnosis not present

## 2023-07-29 DIAGNOSIS — R2681 Unsteadiness on feet: Secondary | ICD-10-CM | POA: Diagnosis not present

## 2023-07-29 DIAGNOSIS — R488 Other symbolic dysfunctions: Secondary | ICD-10-CM | POA: Diagnosis not present

## 2023-07-29 DIAGNOSIS — R4789 Other speech disturbances: Secondary | ICD-10-CM | POA: Diagnosis not present

## 2023-07-30 DIAGNOSIS — I10 Essential (primary) hypertension: Secondary | ICD-10-CM | POA: Diagnosis not present

## 2023-07-30 DIAGNOSIS — E785 Hyperlipidemia, unspecified: Secondary | ICD-10-CM | POA: Diagnosis not present

## 2023-07-30 DIAGNOSIS — E790 Hyperuricemia without signs of inflammatory arthritis and tophaceous disease: Secondary | ICD-10-CM | POA: Diagnosis not present

## 2023-07-30 DIAGNOSIS — R4185 Anosognosia: Secondary | ICD-10-CM | POA: Diagnosis not present

## 2023-07-30 DIAGNOSIS — E559 Vitamin D deficiency, unspecified: Secondary | ICD-10-CM | POA: Diagnosis not present

## 2023-07-30 DIAGNOSIS — Z8673 Personal history of transient ischemic attack (TIA), and cerebral infarction without residual deficits: Secondary | ICD-10-CM | POA: Diagnosis not present

## 2023-07-30 DIAGNOSIS — R2681 Unsteadiness on feet: Secondary | ICD-10-CM | POA: Diagnosis not present

## 2023-07-30 DIAGNOSIS — R488 Other symbolic dysfunctions: Secondary | ICD-10-CM | POA: Diagnosis not present

## 2023-07-30 DIAGNOSIS — R41841 Cognitive communication deficit: Secondary | ICD-10-CM | POA: Diagnosis not present

## 2023-07-30 DIAGNOSIS — R4789 Other speech disturbances: Secondary | ICD-10-CM | POA: Diagnosis not present

## 2023-07-30 DIAGNOSIS — R278 Other lack of coordination: Secondary | ICD-10-CM | POA: Diagnosis not present

## 2023-07-30 DIAGNOSIS — E539 Vitamin B deficiency, unspecified: Secondary | ICD-10-CM | POA: Diagnosis not present

## 2023-07-30 DIAGNOSIS — R269 Unspecified abnormalities of gait and mobility: Secondary | ICD-10-CM | POA: Diagnosis not present

## 2023-07-31 DIAGNOSIS — R41841 Cognitive communication deficit: Secondary | ICD-10-CM | POA: Diagnosis not present

## 2023-07-31 DIAGNOSIS — R2681 Unsteadiness on feet: Secondary | ICD-10-CM | POA: Diagnosis not present

## 2023-07-31 DIAGNOSIS — R278 Other lack of coordination: Secondary | ICD-10-CM | POA: Diagnosis not present

## 2023-07-31 DIAGNOSIS — R4185 Anosognosia: Secondary | ICD-10-CM | POA: Diagnosis not present

## 2023-07-31 DIAGNOSIS — R488 Other symbolic dysfunctions: Secondary | ICD-10-CM | POA: Diagnosis not present

## 2023-07-31 DIAGNOSIS — R4789 Other speech disturbances: Secondary | ICD-10-CM | POA: Diagnosis not present

## 2023-08-04 DIAGNOSIS — R41841 Cognitive communication deficit: Secondary | ICD-10-CM | POA: Diagnosis not present

## 2023-08-04 DIAGNOSIS — R488 Other symbolic dysfunctions: Secondary | ICD-10-CM | POA: Diagnosis not present

## 2023-08-04 DIAGNOSIS — R278 Other lack of coordination: Secondary | ICD-10-CM | POA: Diagnosis not present

## 2023-08-04 DIAGNOSIS — R2681 Unsteadiness on feet: Secondary | ICD-10-CM | POA: Diagnosis not present

## 2023-08-04 DIAGNOSIS — R4789 Other speech disturbances: Secondary | ICD-10-CM | POA: Diagnosis not present

## 2023-08-04 DIAGNOSIS — R4185 Anosognosia: Secondary | ICD-10-CM | POA: Diagnosis not present

## 2023-08-05 DIAGNOSIS — R278 Other lack of coordination: Secondary | ICD-10-CM | POA: Diagnosis not present

## 2023-08-05 DIAGNOSIS — R41841 Cognitive communication deficit: Secondary | ICD-10-CM | POA: Diagnosis not present

## 2023-08-05 DIAGNOSIS — R4789 Other speech disturbances: Secondary | ICD-10-CM | POA: Diagnosis not present

## 2023-08-05 DIAGNOSIS — E785 Hyperlipidemia, unspecified: Secondary | ICD-10-CM | POA: Diagnosis not present

## 2023-08-05 DIAGNOSIS — R2681 Unsteadiness on feet: Secondary | ICD-10-CM | POA: Diagnosis not present

## 2023-08-05 DIAGNOSIS — I10 Essential (primary) hypertension: Secondary | ICD-10-CM | POA: Diagnosis not present

## 2023-08-05 DIAGNOSIS — R4185 Anosognosia: Secondary | ICD-10-CM | POA: Diagnosis not present

## 2023-08-05 DIAGNOSIS — R488 Other symbolic dysfunctions: Secondary | ICD-10-CM | POA: Diagnosis not present

## 2023-08-05 DIAGNOSIS — E790 Hyperuricemia without signs of inflammatory arthritis and tophaceous disease: Secondary | ICD-10-CM | POA: Diagnosis not present

## 2023-08-05 DIAGNOSIS — E539 Vitamin B deficiency, unspecified: Secondary | ICD-10-CM | POA: Diagnosis not present

## 2023-08-05 DIAGNOSIS — E559 Vitamin D deficiency, unspecified: Secondary | ICD-10-CM | POA: Diagnosis not present

## 2023-08-06 DIAGNOSIS — R4789 Other speech disturbances: Secondary | ICD-10-CM | POA: Diagnosis not present

## 2023-08-06 DIAGNOSIS — R488 Other symbolic dysfunctions: Secondary | ICD-10-CM | POA: Diagnosis not present

## 2023-08-06 DIAGNOSIS — R2681 Unsteadiness on feet: Secondary | ICD-10-CM | POA: Diagnosis not present

## 2023-08-06 DIAGNOSIS — R41841 Cognitive communication deficit: Secondary | ICD-10-CM | POA: Diagnosis not present

## 2023-08-06 DIAGNOSIS — R278 Other lack of coordination: Secondary | ICD-10-CM | POA: Diagnosis not present

## 2023-08-06 DIAGNOSIS — R4185 Anosognosia: Secondary | ICD-10-CM | POA: Diagnosis not present

## 2023-08-07 DIAGNOSIS — R488 Other symbolic dysfunctions: Secondary | ICD-10-CM | POA: Diagnosis not present

## 2023-08-07 DIAGNOSIS — R278 Other lack of coordination: Secondary | ICD-10-CM | POA: Diagnosis not present

## 2023-08-07 DIAGNOSIS — R2681 Unsteadiness on feet: Secondary | ICD-10-CM | POA: Diagnosis not present

## 2023-08-07 DIAGNOSIS — R41841 Cognitive communication deficit: Secondary | ICD-10-CM | POA: Diagnosis not present

## 2023-08-07 DIAGNOSIS — R4789 Other speech disturbances: Secondary | ICD-10-CM | POA: Diagnosis not present

## 2023-08-07 DIAGNOSIS — R4185 Anosognosia: Secondary | ICD-10-CM | POA: Diagnosis not present

## 2023-08-12 DIAGNOSIS — R2681 Unsteadiness on feet: Secondary | ICD-10-CM | POA: Diagnosis not present

## 2023-08-12 DIAGNOSIS — R41841 Cognitive communication deficit: Secondary | ICD-10-CM | POA: Diagnosis not present

## 2023-08-12 DIAGNOSIS — R4185 Anosognosia: Secondary | ICD-10-CM | POA: Diagnosis not present

## 2023-08-12 DIAGNOSIS — R4789 Other speech disturbances: Secondary | ICD-10-CM | POA: Diagnosis not present

## 2023-08-12 DIAGNOSIS — R278 Other lack of coordination: Secondary | ICD-10-CM | POA: Diagnosis not present

## 2023-08-12 DIAGNOSIS — R488 Other symbolic dysfunctions: Secondary | ICD-10-CM | POA: Diagnosis not present

## 2023-08-14 DIAGNOSIS — F0390 Unspecified dementia without behavioral disturbance: Secondary | ICD-10-CM | POA: Diagnosis not present

## 2023-08-14 DIAGNOSIS — F411 Generalized anxiety disorder: Secondary | ICD-10-CM | POA: Diagnosis not present

## 2023-08-15 DIAGNOSIS — R4185 Anosognosia: Secondary | ICD-10-CM | POA: Diagnosis not present

## 2023-08-15 DIAGNOSIS — R278 Other lack of coordination: Secondary | ICD-10-CM | POA: Diagnosis not present

## 2023-08-15 DIAGNOSIS — R488 Other symbolic dysfunctions: Secondary | ICD-10-CM | POA: Diagnosis not present

## 2023-08-15 DIAGNOSIS — R4789 Other speech disturbances: Secondary | ICD-10-CM | POA: Diagnosis not present

## 2023-08-15 DIAGNOSIS — R41841 Cognitive communication deficit: Secondary | ICD-10-CM | POA: Diagnosis not present

## 2023-08-15 DIAGNOSIS — R2681 Unsteadiness on feet: Secondary | ICD-10-CM | POA: Diagnosis not present

## 2023-08-18 DIAGNOSIS — Z8673 Personal history of transient ischemic attack (TIA), and cerebral infarction without residual deficits: Secondary | ICD-10-CM | POA: Diagnosis not present

## 2023-08-18 DIAGNOSIS — R278 Other lack of coordination: Secondary | ICD-10-CM | POA: Diagnosis not present

## 2023-08-18 DIAGNOSIS — R488 Other symbolic dysfunctions: Secondary | ICD-10-CM | POA: Diagnosis not present

## 2023-08-18 DIAGNOSIS — F0150 Vascular dementia without behavioral disturbance: Secondary | ICD-10-CM | POA: Diagnosis not present

## 2023-08-18 DIAGNOSIS — R4185 Anosognosia: Secondary | ICD-10-CM | POA: Diagnosis not present

## 2023-08-18 DIAGNOSIS — F411 Generalized anxiety disorder: Secondary | ICD-10-CM | POA: Diagnosis not present

## 2023-08-18 DIAGNOSIS — R2681 Unsteadiness on feet: Secondary | ICD-10-CM | POA: Diagnosis not present

## 2023-08-18 DIAGNOSIS — R41841 Cognitive communication deficit: Secondary | ICD-10-CM | POA: Diagnosis not present

## 2023-08-18 DIAGNOSIS — F331 Major depressive disorder, recurrent, moderate: Secondary | ICD-10-CM | POA: Diagnosis not present

## 2023-08-18 DIAGNOSIS — R4789 Other speech disturbances: Secondary | ICD-10-CM | POA: Diagnosis not present

## 2023-08-20 DIAGNOSIS — R41841 Cognitive communication deficit: Secondary | ICD-10-CM | POA: Diagnosis not present

## 2023-08-20 DIAGNOSIS — R4185 Anosognosia: Secondary | ICD-10-CM | POA: Diagnosis not present

## 2023-08-20 DIAGNOSIS — R488 Other symbolic dysfunctions: Secondary | ICD-10-CM | POA: Diagnosis not present

## 2023-08-20 DIAGNOSIS — R4789 Other speech disturbances: Secondary | ICD-10-CM | POA: Diagnosis not present

## 2023-08-25 DIAGNOSIS — R4185 Anosognosia: Secondary | ICD-10-CM | POA: Diagnosis not present

## 2023-08-25 DIAGNOSIS — R41841 Cognitive communication deficit: Secondary | ICD-10-CM | POA: Diagnosis not present

## 2023-08-25 DIAGNOSIS — R488 Other symbolic dysfunctions: Secondary | ICD-10-CM | POA: Diagnosis not present

## 2023-08-25 DIAGNOSIS — R4789 Other speech disturbances: Secondary | ICD-10-CM | POA: Diagnosis not present

## 2023-08-26 DIAGNOSIS — R4789 Other speech disturbances: Secondary | ICD-10-CM | POA: Diagnosis not present

## 2023-08-26 DIAGNOSIS — J309 Allergic rhinitis, unspecified: Secondary | ICD-10-CM | POA: Diagnosis not present

## 2023-08-26 DIAGNOSIS — R4185 Anosognosia: Secondary | ICD-10-CM | POA: Diagnosis not present

## 2023-08-26 DIAGNOSIS — N182 Chronic kidney disease, stage 2 (mild): Secondary | ICD-10-CM | POA: Diagnosis not present

## 2023-08-26 DIAGNOSIS — I129 Hypertensive chronic kidney disease with stage 1 through stage 4 chronic kidney disease, or unspecified chronic kidney disease: Secondary | ICD-10-CM | POA: Diagnosis not present

## 2023-08-26 DIAGNOSIS — E7800 Pure hypercholesterolemia, unspecified: Secondary | ICD-10-CM | POA: Diagnosis not present

## 2023-08-26 DIAGNOSIS — R21 Rash and other nonspecific skin eruption: Secondary | ICD-10-CM | POA: Diagnosis not present

## 2023-08-26 DIAGNOSIS — R488 Other symbolic dysfunctions: Secondary | ICD-10-CM | POA: Diagnosis not present

## 2023-08-26 DIAGNOSIS — M109 Gout, unspecified: Secondary | ICD-10-CM | POA: Diagnosis not present

## 2023-08-26 DIAGNOSIS — R41841 Cognitive communication deficit: Secondary | ICD-10-CM | POA: Diagnosis not present

## 2023-09-01 DIAGNOSIS — F331 Major depressive disorder, recurrent, moderate: Secondary | ICD-10-CM | POA: Diagnosis not present

## 2023-09-01 DIAGNOSIS — F411 Generalized anxiety disorder: Secondary | ICD-10-CM | POA: Diagnosis not present

## 2023-09-01 DIAGNOSIS — Z8673 Personal history of transient ischemic attack (TIA), and cerebral infarction without residual deficits: Secondary | ICD-10-CM | POA: Diagnosis not present

## 2023-09-01 DIAGNOSIS — F0150 Vascular dementia without behavioral disturbance: Secondary | ICD-10-CM | POA: Diagnosis not present

## 2023-09-02 DIAGNOSIS — R41841 Cognitive communication deficit: Secondary | ICD-10-CM | POA: Diagnosis not present

## 2023-09-02 DIAGNOSIS — R4789 Other speech disturbances: Secondary | ICD-10-CM | POA: Diagnosis not present

## 2023-09-02 DIAGNOSIS — R488 Other symbolic dysfunctions: Secondary | ICD-10-CM | POA: Diagnosis not present

## 2023-09-02 DIAGNOSIS — R4185 Anosognosia: Secondary | ICD-10-CM | POA: Diagnosis not present

## 2023-09-11 DIAGNOSIS — R4185 Anosognosia: Secondary | ICD-10-CM | POA: Diagnosis not present

## 2023-09-11 DIAGNOSIS — R488 Other symbolic dysfunctions: Secondary | ICD-10-CM | POA: Diagnosis not present

## 2023-09-11 DIAGNOSIS — R4789 Other speech disturbances: Secondary | ICD-10-CM | POA: Diagnosis not present

## 2023-09-11 DIAGNOSIS — R41841 Cognitive communication deficit: Secondary | ICD-10-CM | POA: Diagnosis not present

## 2023-09-15 DIAGNOSIS — F0150 Vascular dementia without behavioral disturbance: Secondary | ICD-10-CM | POA: Diagnosis not present

## 2023-09-15 DIAGNOSIS — Z8673 Personal history of transient ischemic attack (TIA), and cerebral infarction without residual deficits: Secondary | ICD-10-CM | POA: Diagnosis not present

## 2023-09-15 DIAGNOSIS — F411 Generalized anxiety disorder: Secondary | ICD-10-CM | POA: Diagnosis not present

## 2023-09-15 DIAGNOSIS — F331 Major depressive disorder, recurrent, moderate: Secondary | ICD-10-CM | POA: Diagnosis not present

## 2023-09-16 DIAGNOSIS — R4789 Other speech disturbances: Secondary | ICD-10-CM | POA: Diagnosis not present

## 2023-09-16 DIAGNOSIS — R41841 Cognitive communication deficit: Secondary | ICD-10-CM | POA: Diagnosis not present

## 2023-09-16 DIAGNOSIS — R488 Other symbolic dysfunctions: Secondary | ICD-10-CM | POA: Diagnosis not present

## 2023-09-16 DIAGNOSIS — R4185 Anosognosia: Secondary | ICD-10-CM | POA: Diagnosis not present

## 2023-09-25 DIAGNOSIS — Z8673 Personal history of transient ischemic attack (TIA), and cerebral infarction without residual deficits: Secondary | ICD-10-CM | POA: Diagnosis not present

## 2023-09-25 DIAGNOSIS — R4789 Other speech disturbances: Secondary | ICD-10-CM | POA: Diagnosis not present

## 2023-09-25 DIAGNOSIS — I959 Hypotension, unspecified: Secondary | ICD-10-CM | POA: Diagnosis not present

## 2023-09-25 DIAGNOSIS — I10 Essential (primary) hypertension: Secondary | ICD-10-CM | POA: Diagnosis not present

## 2023-09-25 DIAGNOSIS — R488 Other symbolic dysfunctions: Secondary | ICD-10-CM | POA: Diagnosis not present

## 2023-09-25 DIAGNOSIS — R41841 Cognitive communication deficit: Secondary | ICD-10-CM | POA: Diagnosis not present

## 2023-09-25 DIAGNOSIS — R4185 Anosognosia: Secondary | ICD-10-CM | POA: Diagnosis not present

## 2023-09-25 DIAGNOSIS — F0150 Vascular dementia without behavioral disturbance: Secondary | ICD-10-CM | POA: Diagnosis not present

## 2023-09-25 DIAGNOSIS — R21 Rash and other nonspecific skin eruption: Secondary | ICD-10-CM | POA: Diagnosis not present

## 2023-09-25 DIAGNOSIS — F0280 Dementia in other diseases classified elsewhere without behavioral disturbance: Secondary | ICD-10-CM | POA: Diagnosis not present

## 2023-09-25 DIAGNOSIS — Z7982 Long term (current) use of aspirin: Secondary | ICD-10-CM | POA: Diagnosis not present

## 2023-09-29 DIAGNOSIS — R4789 Other speech disturbances: Secondary | ICD-10-CM | POA: Diagnosis not present

## 2023-09-29 DIAGNOSIS — R4185 Anosognosia: Secondary | ICD-10-CM | POA: Diagnosis not present

## 2023-09-29 DIAGNOSIS — R41841 Cognitive communication deficit: Secondary | ICD-10-CM | POA: Diagnosis not present

## 2023-09-29 DIAGNOSIS — R488 Other symbolic dysfunctions: Secondary | ICD-10-CM | POA: Diagnosis not present

## 2023-10-06 DIAGNOSIS — F331 Major depressive disorder, recurrent, moderate: Secondary | ICD-10-CM | POA: Diagnosis not present

## 2023-10-06 DIAGNOSIS — F0150 Vascular dementia without behavioral disturbance: Secondary | ICD-10-CM | POA: Diagnosis not present

## 2023-10-06 DIAGNOSIS — F0390 Unspecified dementia without behavioral disturbance: Secondary | ICD-10-CM | POA: Diagnosis not present

## 2023-10-06 DIAGNOSIS — Z8673 Personal history of transient ischemic attack (TIA), and cerebral infarction without residual deficits: Secondary | ICD-10-CM | POA: Diagnosis not present

## 2023-10-06 DIAGNOSIS — F411 Generalized anxiety disorder: Secondary | ICD-10-CM | POA: Diagnosis not present

## 2023-10-07 DIAGNOSIS — R4789 Other speech disturbances: Secondary | ICD-10-CM | POA: Diagnosis not present

## 2023-10-07 DIAGNOSIS — R4185 Anosognosia: Secondary | ICD-10-CM | POA: Diagnosis not present

## 2023-10-07 DIAGNOSIS — R488 Other symbolic dysfunctions: Secondary | ICD-10-CM | POA: Diagnosis not present

## 2023-10-07 DIAGNOSIS — R41841 Cognitive communication deficit: Secondary | ICD-10-CM | POA: Diagnosis not present

## 2023-10-16 DIAGNOSIS — R488 Other symbolic dysfunctions: Secondary | ICD-10-CM | POA: Diagnosis not present

## 2023-10-16 DIAGNOSIS — R41841 Cognitive communication deficit: Secondary | ICD-10-CM | POA: Diagnosis not present

## 2023-10-16 DIAGNOSIS — R4789 Other speech disturbances: Secondary | ICD-10-CM | POA: Diagnosis not present

## 2023-10-16 DIAGNOSIS — R4185 Anosognosia: Secondary | ICD-10-CM | POA: Diagnosis not present

## 2023-10-22 DIAGNOSIS — R4185 Anosognosia: Secondary | ICD-10-CM | POA: Diagnosis not present

## 2023-10-22 DIAGNOSIS — R488 Other symbolic dysfunctions: Secondary | ICD-10-CM | POA: Diagnosis not present

## 2023-10-22 DIAGNOSIS — R4789 Other speech disturbances: Secondary | ICD-10-CM | POA: Diagnosis not present

## 2023-10-22 DIAGNOSIS — R41841 Cognitive communication deficit: Secondary | ICD-10-CM | POA: Diagnosis not present

## 2023-10-23 DIAGNOSIS — M109 Gout, unspecified: Secondary | ICD-10-CM | POA: Diagnosis not present

## 2023-10-23 DIAGNOSIS — J309 Allergic rhinitis, unspecified: Secondary | ICD-10-CM | POA: Diagnosis not present

## 2023-10-23 DIAGNOSIS — E7800 Pure hypercholesterolemia, unspecified: Secondary | ICD-10-CM | POA: Diagnosis not present

## 2023-10-27 DIAGNOSIS — R41841 Cognitive communication deficit: Secondary | ICD-10-CM | POA: Diagnosis not present

## 2023-10-27 DIAGNOSIS — R4185 Anosognosia: Secondary | ICD-10-CM | POA: Diagnosis not present

## 2023-10-27 DIAGNOSIS — R4789 Other speech disturbances: Secondary | ICD-10-CM | POA: Diagnosis not present

## 2023-10-27 DIAGNOSIS — R488 Other symbolic dysfunctions: Secondary | ICD-10-CM | POA: Diagnosis not present

## 2023-11-03 DIAGNOSIS — Z8673 Personal history of transient ischemic attack (TIA), and cerebral infarction without residual deficits: Secondary | ICD-10-CM | POA: Diagnosis not present

## 2023-11-03 DIAGNOSIS — F331 Major depressive disorder, recurrent, moderate: Secondary | ICD-10-CM | POA: Diagnosis not present

## 2023-11-03 DIAGNOSIS — F0150 Vascular dementia without behavioral disturbance: Secondary | ICD-10-CM | POA: Diagnosis not present

## 2023-11-03 DIAGNOSIS — F411 Generalized anxiety disorder: Secondary | ICD-10-CM | POA: Diagnosis not present

## 2023-11-05 DIAGNOSIS — M6259 Muscle wasting and atrophy, not elsewhere classified, multiple sites: Secondary | ICD-10-CM | POA: Diagnosis not present

## 2023-11-05 DIAGNOSIS — R488 Other symbolic dysfunctions: Secondary | ICD-10-CM | POA: Diagnosis not present

## 2023-11-05 DIAGNOSIS — R2689 Other abnormalities of gait and mobility: Secondary | ICD-10-CM | POA: Diagnosis not present

## 2023-11-05 DIAGNOSIS — R4185 Anosognosia: Secondary | ICD-10-CM | POA: Diagnosis not present

## 2023-11-05 DIAGNOSIS — R41841 Cognitive communication deficit: Secondary | ICD-10-CM | POA: Diagnosis not present

## 2023-11-05 DIAGNOSIS — R2681 Unsteadiness on feet: Secondary | ICD-10-CM | POA: Diagnosis not present

## 2023-11-05 DIAGNOSIS — R278 Other lack of coordination: Secondary | ICD-10-CM | POA: Diagnosis not present

## 2023-11-05 DIAGNOSIS — R4789 Other speech disturbances: Secondary | ICD-10-CM | POA: Diagnosis not present

## 2023-11-07 DIAGNOSIS — M6259 Muscle wasting and atrophy, not elsewhere classified, multiple sites: Secondary | ICD-10-CM | POA: Diagnosis not present

## 2023-11-07 DIAGNOSIS — R4789 Other speech disturbances: Secondary | ICD-10-CM | POA: Diagnosis not present

## 2023-11-07 DIAGNOSIS — R2689 Other abnormalities of gait and mobility: Secondary | ICD-10-CM | POA: Diagnosis not present

## 2023-11-07 DIAGNOSIS — R278 Other lack of coordination: Secondary | ICD-10-CM | POA: Diagnosis not present

## 2023-11-07 DIAGNOSIS — R41841 Cognitive communication deficit: Secondary | ICD-10-CM | POA: Diagnosis not present

## 2023-11-07 DIAGNOSIS — R4185 Anosognosia: Secondary | ICD-10-CM | POA: Diagnosis not present

## 2023-11-07 DIAGNOSIS — R2681 Unsteadiness on feet: Secondary | ICD-10-CM | POA: Diagnosis not present

## 2023-11-07 DIAGNOSIS — R488 Other symbolic dysfunctions: Secondary | ICD-10-CM | POA: Diagnosis not present

## 2023-11-12 DIAGNOSIS — M6259 Muscle wasting and atrophy, not elsewhere classified, multiple sites: Secondary | ICD-10-CM | POA: Diagnosis not present

## 2023-11-12 DIAGNOSIS — R278 Other lack of coordination: Secondary | ICD-10-CM | POA: Diagnosis not present

## 2023-11-12 DIAGNOSIS — R488 Other symbolic dysfunctions: Secondary | ICD-10-CM | POA: Diagnosis not present

## 2023-11-12 DIAGNOSIS — R2681 Unsteadiness on feet: Secondary | ICD-10-CM | POA: Diagnosis not present

## 2023-11-12 DIAGNOSIS — D485 Neoplasm of uncertain behavior of skin: Secondary | ICD-10-CM | POA: Diagnosis not present

## 2023-11-12 DIAGNOSIS — R4185 Anosognosia: Secondary | ICD-10-CM | POA: Diagnosis not present

## 2023-11-12 DIAGNOSIS — R2689 Other abnormalities of gait and mobility: Secondary | ICD-10-CM | POA: Diagnosis not present

## 2023-11-12 DIAGNOSIS — R4789 Other speech disturbances: Secondary | ICD-10-CM | POA: Diagnosis not present

## 2023-11-12 DIAGNOSIS — C44629 Squamous cell carcinoma of skin of left upper limb, including shoulder: Secondary | ICD-10-CM | POA: Diagnosis not present

## 2023-11-12 DIAGNOSIS — R41841 Cognitive communication deficit: Secondary | ICD-10-CM | POA: Diagnosis not present

## 2023-11-12 DIAGNOSIS — Z85828 Personal history of other malignant neoplasm of skin: Secondary | ICD-10-CM | POA: Diagnosis not present

## 2023-11-12 DIAGNOSIS — L570 Actinic keratosis: Secondary | ICD-10-CM | POA: Diagnosis not present

## 2023-11-14 DIAGNOSIS — M6259 Muscle wasting and atrophy, not elsewhere classified, multiple sites: Secondary | ICD-10-CM | POA: Diagnosis not present

## 2023-11-14 DIAGNOSIS — R2681 Unsteadiness on feet: Secondary | ICD-10-CM | POA: Diagnosis not present

## 2023-11-14 DIAGNOSIS — R2689 Other abnormalities of gait and mobility: Secondary | ICD-10-CM | POA: Diagnosis not present

## 2023-11-14 DIAGNOSIS — R488 Other symbolic dysfunctions: Secondary | ICD-10-CM | POA: Diagnosis not present

## 2023-11-14 DIAGNOSIS — R278 Other lack of coordination: Secondary | ICD-10-CM | POA: Diagnosis not present

## 2023-11-14 DIAGNOSIS — R4185 Anosognosia: Secondary | ICD-10-CM | POA: Diagnosis not present

## 2023-11-14 DIAGNOSIS — R41841 Cognitive communication deficit: Secondary | ICD-10-CM | POA: Diagnosis not present

## 2023-11-14 DIAGNOSIS — R4789 Other speech disturbances: Secondary | ICD-10-CM | POA: Diagnosis not present

## 2023-11-16 DIAGNOSIS — R278 Other lack of coordination: Secondary | ICD-10-CM | POA: Diagnosis not present

## 2023-11-16 DIAGNOSIS — R2689 Other abnormalities of gait and mobility: Secondary | ICD-10-CM | POA: Diagnosis not present

## 2023-11-16 DIAGNOSIS — M6259 Muscle wasting and atrophy, not elsewhere classified, multiple sites: Secondary | ICD-10-CM | POA: Diagnosis not present

## 2023-11-16 DIAGNOSIS — R488 Other symbolic dysfunctions: Secondary | ICD-10-CM | POA: Diagnosis not present

## 2023-11-16 DIAGNOSIS — R41841 Cognitive communication deficit: Secondary | ICD-10-CM | POA: Diagnosis not present

## 2023-11-16 DIAGNOSIS — R2681 Unsteadiness on feet: Secondary | ICD-10-CM | POA: Diagnosis not present

## 2023-11-16 DIAGNOSIS — R4185 Anosognosia: Secondary | ICD-10-CM | POA: Diagnosis not present

## 2023-11-16 DIAGNOSIS — R4789 Other speech disturbances: Secondary | ICD-10-CM | POA: Diagnosis not present

## 2023-11-18 DIAGNOSIS — M6259 Muscle wasting and atrophy, not elsewhere classified, multiple sites: Secondary | ICD-10-CM | POA: Diagnosis not present

## 2023-11-18 DIAGNOSIS — R278 Other lack of coordination: Secondary | ICD-10-CM | POA: Diagnosis not present

## 2023-11-18 DIAGNOSIS — R4185 Anosognosia: Secondary | ICD-10-CM | POA: Diagnosis not present

## 2023-11-18 DIAGNOSIS — R2681 Unsteadiness on feet: Secondary | ICD-10-CM | POA: Diagnosis not present

## 2023-11-18 DIAGNOSIS — R4789 Other speech disturbances: Secondary | ICD-10-CM | POA: Diagnosis not present

## 2023-11-18 DIAGNOSIS — R41841 Cognitive communication deficit: Secondary | ICD-10-CM | POA: Diagnosis not present

## 2023-11-18 DIAGNOSIS — N182 Chronic kidney disease, stage 2 (mild): Secondary | ICD-10-CM | POA: Diagnosis not present

## 2023-11-18 DIAGNOSIS — I10 Essential (primary) hypertension: Secondary | ICD-10-CM | POA: Diagnosis not present

## 2023-11-18 DIAGNOSIS — R2689 Other abnormalities of gait and mobility: Secondary | ICD-10-CM | POA: Diagnosis not present

## 2023-11-18 DIAGNOSIS — R488 Other symbolic dysfunctions: Secondary | ICD-10-CM | POA: Diagnosis not present

## 2023-11-19 DIAGNOSIS — R488 Other symbolic dysfunctions: Secondary | ICD-10-CM | POA: Diagnosis not present

## 2023-11-19 DIAGNOSIS — R2689 Other abnormalities of gait and mobility: Secondary | ICD-10-CM | POA: Diagnosis not present

## 2023-11-19 DIAGNOSIS — R4185 Anosognosia: Secondary | ICD-10-CM | POA: Diagnosis not present

## 2023-11-19 DIAGNOSIS — M6259 Muscle wasting and atrophy, not elsewhere classified, multiple sites: Secondary | ICD-10-CM | POA: Diagnosis not present

## 2023-11-19 DIAGNOSIS — R41841 Cognitive communication deficit: Secondary | ICD-10-CM | POA: Diagnosis not present

## 2023-11-19 DIAGNOSIS — R278 Other lack of coordination: Secondary | ICD-10-CM | POA: Diagnosis not present

## 2023-11-19 DIAGNOSIS — R4789 Other speech disturbances: Secondary | ICD-10-CM | POA: Diagnosis not present

## 2023-11-19 DIAGNOSIS — R2681 Unsteadiness on feet: Secondary | ICD-10-CM | POA: Diagnosis not present

## 2023-11-20 DIAGNOSIS — F0283 Dementia in other diseases classified elsewhere, unspecified severity, with mood disturbance: Secondary | ICD-10-CM | POA: Diagnosis not present

## 2023-11-20 DIAGNOSIS — I10 Essential (primary) hypertension: Secondary | ICD-10-CM | POA: Diagnosis not present

## 2023-11-20 DIAGNOSIS — F0153 Vascular dementia, unspecified severity, with mood disturbance: Secondary | ICD-10-CM | POA: Diagnosis not present

## 2023-11-20 DIAGNOSIS — M109 Gout, unspecified: Secondary | ICD-10-CM | POA: Diagnosis not present

## 2023-11-24 DIAGNOSIS — R278 Other lack of coordination: Secondary | ICD-10-CM | POA: Diagnosis not present

## 2023-11-24 DIAGNOSIS — R2681 Unsteadiness on feet: Secondary | ICD-10-CM | POA: Diagnosis not present

## 2023-11-24 DIAGNOSIS — R4185 Anosognosia: Secondary | ICD-10-CM | POA: Diagnosis not present

## 2023-11-24 DIAGNOSIS — R488 Other symbolic dysfunctions: Secondary | ICD-10-CM | POA: Diagnosis not present

## 2023-11-24 DIAGNOSIS — R41841 Cognitive communication deficit: Secondary | ICD-10-CM | POA: Diagnosis not present

## 2023-11-24 DIAGNOSIS — R2689 Other abnormalities of gait and mobility: Secondary | ICD-10-CM | POA: Diagnosis not present

## 2023-11-24 DIAGNOSIS — M6259 Muscle wasting and atrophy, not elsewhere classified, multiple sites: Secondary | ICD-10-CM | POA: Diagnosis not present

## 2023-11-24 DIAGNOSIS — R4789 Other speech disturbances: Secondary | ICD-10-CM | POA: Diagnosis not present

## 2023-11-25 DIAGNOSIS — R4789 Other speech disturbances: Secondary | ICD-10-CM | POA: Diagnosis not present

## 2023-11-25 DIAGNOSIS — R2689 Other abnormalities of gait and mobility: Secondary | ICD-10-CM | POA: Diagnosis not present

## 2023-11-25 DIAGNOSIS — R2681 Unsteadiness on feet: Secondary | ICD-10-CM | POA: Diagnosis not present

## 2023-11-25 DIAGNOSIS — R488 Other symbolic dysfunctions: Secondary | ICD-10-CM | POA: Diagnosis not present

## 2023-11-25 DIAGNOSIS — R4185 Anosognosia: Secondary | ICD-10-CM | POA: Diagnosis not present

## 2023-11-25 DIAGNOSIS — R278 Other lack of coordination: Secondary | ICD-10-CM | POA: Diagnosis not present

## 2023-11-25 DIAGNOSIS — R41841 Cognitive communication deficit: Secondary | ICD-10-CM | POA: Diagnosis not present

## 2023-11-25 DIAGNOSIS — M6259 Muscle wasting and atrophy, not elsewhere classified, multiple sites: Secondary | ICD-10-CM | POA: Diagnosis not present

## 2023-11-26 DIAGNOSIS — R41841 Cognitive communication deficit: Secondary | ICD-10-CM | POA: Diagnosis not present

## 2023-11-28 DIAGNOSIS — I10 Essential (primary) hypertension: Secondary | ICD-10-CM | POA: Diagnosis not present

## 2023-11-28 DIAGNOSIS — N182 Chronic kidney disease, stage 2 (mild): Secondary | ICD-10-CM | POA: Diagnosis not present

## 2023-12-01 DIAGNOSIS — F331 Major depressive disorder, recurrent, moderate: Secondary | ICD-10-CM | POA: Diagnosis not present

## 2023-12-01 DIAGNOSIS — Z8673 Personal history of transient ischemic attack (TIA), and cerebral infarction without residual deficits: Secondary | ICD-10-CM | POA: Diagnosis not present

## 2023-12-01 DIAGNOSIS — F0150 Vascular dementia without behavioral disturbance: Secondary | ICD-10-CM | POA: Diagnosis not present

## 2023-12-01 DIAGNOSIS — F411 Generalized anxiety disorder: Secondary | ICD-10-CM | POA: Diagnosis not present

## 2023-12-02 DIAGNOSIS — M6259 Muscle wasting and atrophy, not elsewhere classified, multiple sites: Secondary | ICD-10-CM | POA: Diagnosis not present

## 2023-12-02 DIAGNOSIS — R41841 Cognitive communication deficit: Secondary | ICD-10-CM | POA: Diagnosis not present

## 2023-12-02 DIAGNOSIS — R4185 Anosognosia: Secondary | ICD-10-CM | POA: Diagnosis not present

## 2023-12-02 DIAGNOSIS — R278 Other lack of coordination: Secondary | ICD-10-CM | POA: Diagnosis not present

## 2023-12-02 DIAGNOSIS — R4789 Other speech disturbances: Secondary | ICD-10-CM | POA: Diagnosis not present

## 2023-12-02 DIAGNOSIS — R488 Other symbolic dysfunctions: Secondary | ICD-10-CM | POA: Diagnosis not present

## 2023-12-02 DIAGNOSIS — R2681 Unsteadiness on feet: Secondary | ICD-10-CM | POA: Diagnosis not present

## 2023-12-02 DIAGNOSIS — R2689 Other abnormalities of gait and mobility: Secondary | ICD-10-CM | POA: Diagnosis not present

## 2023-12-03 DIAGNOSIS — R278 Other lack of coordination: Secondary | ICD-10-CM | POA: Diagnosis not present

## 2023-12-03 DIAGNOSIS — R4185 Anosognosia: Secondary | ICD-10-CM | POA: Diagnosis not present

## 2023-12-03 DIAGNOSIS — R488 Other symbolic dysfunctions: Secondary | ICD-10-CM | POA: Diagnosis not present

## 2023-12-03 DIAGNOSIS — R2689 Other abnormalities of gait and mobility: Secondary | ICD-10-CM | POA: Diagnosis not present

## 2023-12-03 DIAGNOSIS — R2681 Unsteadiness on feet: Secondary | ICD-10-CM | POA: Diagnosis not present

## 2023-12-03 DIAGNOSIS — M6259 Muscle wasting and atrophy, not elsewhere classified, multiple sites: Secondary | ICD-10-CM | POA: Diagnosis not present

## 2023-12-03 DIAGNOSIS — R4789 Other speech disturbances: Secondary | ICD-10-CM | POA: Diagnosis not present

## 2023-12-03 DIAGNOSIS — R41841 Cognitive communication deficit: Secondary | ICD-10-CM | POA: Diagnosis not present

## 2023-12-09 DIAGNOSIS — R4789 Other speech disturbances: Secondary | ICD-10-CM | POA: Diagnosis not present

## 2023-12-09 DIAGNOSIS — R2681 Unsteadiness on feet: Secondary | ICD-10-CM | POA: Diagnosis not present

## 2023-12-09 DIAGNOSIS — R41841 Cognitive communication deficit: Secondary | ICD-10-CM | POA: Diagnosis not present

## 2023-12-09 DIAGNOSIS — M6259 Muscle wasting and atrophy, not elsewhere classified, multiple sites: Secondary | ICD-10-CM | POA: Diagnosis not present

## 2023-12-09 DIAGNOSIS — R2689 Other abnormalities of gait and mobility: Secondary | ICD-10-CM | POA: Diagnosis not present

## 2023-12-09 DIAGNOSIS — R4185 Anosognosia: Secondary | ICD-10-CM | POA: Diagnosis not present

## 2023-12-09 DIAGNOSIS — R278 Other lack of coordination: Secondary | ICD-10-CM | POA: Diagnosis not present

## 2023-12-09 DIAGNOSIS — R488 Other symbolic dysfunctions: Secondary | ICD-10-CM | POA: Diagnosis not present

## 2023-12-11 DIAGNOSIS — M6259 Muscle wasting and atrophy, not elsewhere classified, multiple sites: Secondary | ICD-10-CM | POA: Diagnosis not present

## 2023-12-11 DIAGNOSIS — R41841 Cognitive communication deficit: Secondary | ICD-10-CM | POA: Diagnosis not present

## 2023-12-11 DIAGNOSIS — R2689 Other abnormalities of gait and mobility: Secondary | ICD-10-CM | POA: Diagnosis not present

## 2023-12-11 DIAGNOSIS — R4185 Anosognosia: Secondary | ICD-10-CM | POA: Diagnosis not present

## 2023-12-11 DIAGNOSIS — R2681 Unsteadiness on feet: Secondary | ICD-10-CM | POA: Diagnosis not present

## 2023-12-11 DIAGNOSIS — R488 Other symbolic dysfunctions: Secondary | ICD-10-CM | POA: Diagnosis not present

## 2023-12-11 DIAGNOSIS — R278 Other lack of coordination: Secondary | ICD-10-CM | POA: Diagnosis not present

## 2023-12-11 DIAGNOSIS — R4789 Other speech disturbances: Secondary | ICD-10-CM | POA: Diagnosis not present

## 2023-12-17 DIAGNOSIS — Z85828 Personal history of other malignant neoplasm of skin: Secondary | ICD-10-CM | POA: Diagnosis not present

## 2023-12-17 DIAGNOSIS — R278 Other lack of coordination: Secondary | ICD-10-CM | POA: Diagnosis not present

## 2023-12-17 DIAGNOSIS — R488 Other symbolic dysfunctions: Secondary | ICD-10-CM | POA: Diagnosis not present

## 2023-12-17 DIAGNOSIS — L821 Other seborrheic keratosis: Secondary | ICD-10-CM | POA: Diagnosis not present

## 2023-12-17 DIAGNOSIS — R41841 Cognitive communication deficit: Secondary | ICD-10-CM | POA: Diagnosis not present

## 2023-12-17 DIAGNOSIS — L570 Actinic keratosis: Secondary | ICD-10-CM | POA: Diagnosis not present

## 2023-12-17 DIAGNOSIS — R4789 Other speech disturbances: Secondary | ICD-10-CM | POA: Diagnosis not present

## 2023-12-17 DIAGNOSIS — R4185 Anosognosia: Secondary | ICD-10-CM | POA: Diagnosis not present

## 2023-12-17 DIAGNOSIS — M6259 Muscle wasting and atrophy, not elsewhere classified, multiple sites: Secondary | ICD-10-CM | POA: Diagnosis not present

## 2023-12-17 DIAGNOSIS — R2681 Unsteadiness on feet: Secondary | ICD-10-CM | POA: Diagnosis not present

## 2023-12-17 DIAGNOSIS — R2689 Other abnormalities of gait and mobility: Secondary | ICD-10-CM | POA: Diagnosis not present

## 2023-12-24 DIAGNOSIS — M6259 Muscle wasting and atrophy, not elsewhere classified, multiple sites: Secondary | ICD-10-CM | POA: Diagnosis not present

## 2023-12-24 DIAGNOSIS — R2689 Other abnormalities of gait and mobility: Secondary | ICD-10-CM | POA: Diagnosis not present

## 2023-12-24 DIAGNOSIS — R278 Other lack of coordination: Secondary | ICD-10-CM | POA: Diagnosis not present

## 2023-12-24 DIAGNOSIS — R2681 Unsteadiness on feet: Secondary | ICD-10-CM | POA: Diagnosis not present

## 2023-12-25 DIAGNOSIS — R278 Other lack of coordination: Secondary | ICD-10-CM | POA: Diagnosis not present

## 2023-12-25 DIAGNOSIS — M6259 Muscle wasting and atrophy, not elsewhere classified, multiple sites: Secondary | ICD-10-CM | POA: Diagnosis not present

## 2023-12-25 DIAGNOSIS — R2689 Other abnormalities of gait and mobility: Secondary | ICD-10-CM | POA: Diagnosis not present

## 2023-12-25 DIAGNOSIS — R2681 Unsteadiness on feet: Secondary | ICD-10-CM | POA: Diagnosis not present

## 2023-12-29 DIAGNOSIS — F0150 Vascular dementia without behavioral disturbance: Secondary | ICD-10-CM | POA: Diagnosis not present

## 2023-12-29 DIAGNOSIS — F331 Major depressive disorder, recurrent, moderate: Secondary | ICD-10-CM | POA: Diagnosis not present

## 2023-12-29 DIAGNOSIS — Z79899 Other long term (current) drug therapy: Secondary | ICD-10-CM | POA: Diagnosis not present

## 2023-12-29 DIAGNOSIS — Z8673 Personal history of transient ischemic attack (TIA), and cerebral infarction without residual deficits: Secondary | ICD-10-CM | POA: Diagnosis not present

## 2023-12-29 DIAGNOSIS — F411 Generalized anxiety disorder: Secondary | ICD-10-CM | POA: Diagnosis not present

## 2023-12-30 DIAGNOSIS — M6259 Muscle wasting and atrophy, not elsewhere classified, multiple sites: Secondary | ICD-10-CM | POA: Diagnosis not present

## 2023-12-30 DIAGNOSIS — R278 Other lack of coordination: Secondary | ICD-10-CM | POA: Diagnosis not present

## 2023-12-30 DIAGNOSIS — R2689 Other abnormalities of gait and mobility: Secondary | ICD-10-CM | POA: Diagnosis not present

## 2023-12-30 DIAGNOSIS — R2681 Unsteadiness on feet: Secondary | ICD-10-CM | POA: Diagnosis not present

## 2024-01-01 DIAGNOSIS — R2681 Unsteadiness on feet: Secondary | ICD-10-CM | POA: Diagnosis not present

## 2024-01-01 DIAGNOSIS — M6259 Muscle wasting and atrophy, not elsewhere classified, multiple sites: Secondary | ICD-10-CM | POA: Diagnosis not present

## 2024-01-01 DIAGNOSIS — R2689 Other abnormalities of gait and mobility: Secondary | ICD-10-CM | POA: Diagnosis not present

## 2024-01-01 DIAGNOSIS — R278 Other lack of coordination: Secondary | ICD-10-CM | POA: Diagnosis not present

## 2024-01-05 DIAGNOSIS — R2681 Unsteadiness on feet: Secondary | ICD-10-CM | POA: Diagnosis not present

## 2024-01-05 DIAGNOSIS — M6259 Muscle wasting and atrophy, not elsewhere classified, multiple sites: Secondary | ICD-10-CM | POA: Diagnosis not present

## 2024-01-05 DIAGNOSIS — R278 Other lack of coordination: Secondary | ICD-10-CM | POA: Diagnosis not present

## 2024-01-05 DIAGNOSIS — R2689 Other abnormalities of gait and mobility: Secondary | ICD-10-CM | POA: Diagnosis not present

## 2024-01-12 DIAGNOSIS — F0283 Dementia in other diseases classified elsewhere, unspecified severity, with mood disturbance: Secondary | ICD-10-CM | POA: Diagnosis not present

## 2024-01-12 DIAGNOSIS — Z8673 Personal history of transient ischemic attack (TIA), and cerebral infarction without residual deficits: Secondary | ICD-10-CM | POA: Diagnosis not present

## 2024-01-12 DIAGNOSIS — M109 Gout, unspecified: Secondary | ICD-10-CM | POA: Diagnosis not present

## 2024-01-12 DIAGNOSIS — E7800 Pure hypercholesterolemia, unspecified: Secondary | ICD-10-CM | POA: Diagnosis not present

## 2024-01-12 DIAGNOSIS — J309 Allergic rhinitis, unspecified: Secondary | ICD-10-CM | POA: Diagnosis not present

## 2024-01-13 DIAGNOSIS — R2689 Other abnormalities of gait and mobility: Secondary | ICD-10-CM | POA: Diagnosis not present

## 2024-01-13 DIAGNOSIS — R278 Other lack of coordination: Secondary | ICD-10-CM | POA: Diagnosis not present

## 2024-01-13 DIAGNOSIS — R2681 Unsteadiness on feet: Secondary | ICD-10-CM | POA: Diagnosis not present

## 2024-01-13 DIAGNOSIS — M6259 Muscle wasting and atrophy, not elsewhere classified, multiple sites: Secondary | ICD-10-CM | POA: Diagnosis not present

## 2024-01-26 DIAGNOSIS — Z79899 Other long term (current) drug therapy: Secondary | ICD-10-CM | POA: Diagnosis not present

## 2024-01-26 DIAGNOSIS — F331 Major depressive disorder, recurrent, moderate: Secondary | ICD-10-CM | POA: Diagnosis not present

## 2024-01-26 DIAGNOSIS — Z8673 Personal history of transient ischemic attack (TIA), and cerebral infarction without residual deficits: Secondary | ICD-10-CM | POA: Diagnosis not present

## 2024-01-26 DIAGNOSIS — F411 Generalized anxiety disorder: Secondary | ICD-10-CM | POA: Diagnosis not present

## 2024-01-26 DIAGNOSIS — F0150 Vascular dementia without behavioral disturbance: Secondary | ICD-10-CM | POA: Diagnosis not present
